# Patient Record
Sex: Female | Born: 1990
Health system: Southern US, Community
[De-identification: ages and names within clinical notes are randomized; demographics above are authoritative.]

## PROBLEM LIST (undated history)

## (undated) DIAGNOSIS — J45909 Unspecified asthma, uncomplicated: Secondary | ICD-10-CM

---

## 2016-02-21 ENCOUNTER — Emergency Department (HOSPITAL_BASED_OUTPATIENT_CLINIC_OR_DEPARTMENT_OTHER): Payer: Self-pay

## 2016-02-21 ENCOUNTER — Encounter (HOSPITAL_BASED_OUTPATIENT_CLINIC_OR_DEPARTMENT_OTHER): Payer: Self-pay

## 2016-02-21 ENCOUNTER — Emergency Department (HOSPITAL_BASED_OUTPATIENT_CLINIC_OR_DEPARTMENT_OTHER)
Admission: EM | Admit: 2016-02-21 | Discharge: 2016-02-22 | Disposition: A | Discharge status: Home / Self Care | Payer: Self-pay | Attending: Emergency Medicine | Admitting: Emergency Medicine

## 2016-02-21 DIAGNOSIS — R55 Syncope and collapse: Secondary | ICD-10-CM | POA: Insufficient documentation

## 2016-02-21 DIAGNOSIS — J45909 Unspecified asthma, uncomplicated: Secondary | ICD-10-CM | POA: Insufficient documentation

## 2016-02-21 DIAGNOSIS — Z3490 Encounter for supervision of normal pregnancy, unspecified, unspecified trimester: Secondary | ICD-10-CM

## 2016-02-21 DIAGNOSIS — Z3A13 13 weeks gestation of pregnancy: Secondary | ICD-10-CM | POA: Insufficient documentation

## 2016-02-21 DIAGNOSIS — O2342 Unspecified infection of urinary tract in pregnancy, second trimester: Secondary | ICD-10-CM

## 2016-02-21 DIAGNOSIS — O9989 Other specified diseases and conditions complicating pregnancy, childbirth and the puerperium: Secondary | ICD-10-CM | POA: Insufficient documentation

## 2016-02-21 DIAGNOSIS — O2341 Unspecified infection of urinary tract in pregnancy, first trimester: Secondary | ICD-10-CM | POA: Insufficient documentation

## 2016-02-21 HISTORY — DX: Unspecified asthma, uncomplicated: J45.909

## 2016-02-21 LAB — URINE MICROSCOPIC-ADD ON

## 2016-02-21 LAB — COMPREHENSIVE METABOLIC PANEL
ALBUMIN: 3.4 g/dL — AB (ref 3.5–5.0)
ALT: 14 U/L (ref 14–54)
ANION GAP: 7 (ref 5–15)
AST: 20 U/L (ref 15–41)
Alkaline Phosphatase: 54 U/L (ref 38–126)
BILIRUBIN TOTAL: 0.4 mg/dL (ref 0.3–1.2)
BUN: 7 mg/dL (ref 6–20)
CHLORIDE: 103 mmol/L (ref 101–111)
CO2: 25 mmol/L (ref 22–32)
Calcium: 8.5 mg/dL — ABNORMAL LOW (ref 8.9–10.3)
Creatinine, Ser: 0.43 mg/dL — ABNORMAL LOW (ref 0.44–1.00)
GFR calc Af Amer: >60 mL/min (ref >60)
GFR calc non Af Amer: >60 mL/min (ref >60)
GLUCOSE: 82 mg/dL (ref 65–99)
POTASSIUM: 3.2 mmol/L — AB (ref 3.5–5.1)
SODIUM: 135 mmol/L (ref 135–145)
TOTAL PROTEIN: 7.1 g/dL (ref 6.5–8.1)

## 2016-02-21 LAB — CBC WITH DIFFERENTIAL/PLATELET
Basophils Absolute: 0 10*3/uL (ref 0.0–0.1)
Basophils Relative: 0 %
EOS PCT: 2 %
Eosinophils Absolute: 0.2 10*3/uL (ref 0.0–0.7)
HEMATOCRIT: 30.6 % — AB (ref 36.0–46.0)
Hemoglobin: 10.6 g/dL — ABNORMAL LOW (ref 12.0–15.0)
LYMPHS ABS: 1.8 10*3/uL (ref 0.7–4.0)
LYMPHS PCT: 22 %
MCH: 27.1 pg (ref 26.0–34.0)
MCHC: 34.6 g/dL (ref 30.0–36.0)
MCV: 78.3 fL (ref 78.0–100.0)
MONO ABS: 0.5 10*3/uL (ref 0.1–1.0)
MONOS PCT: 6 %
NEUTROS ABS: 5.8 10*3/uL (ref 1.7–7.7)
Neutrophils Relative %: 70 %
PLATELETS: 252 10*3/uL (ref 150–400)
RBC: 3.91 MIL/uL (ref 3.87–5.11)
RDW: 14.5 % (ref 11.5–15.5)
WBC: 8.3 10*3/uL (ref 4.0–10.5)

## 2016-02-21 LAB — URINALYSIS, ROUTINE W REFLEX MICROSCOPIC
Bilirubin Urine: NEGATIVE
GLUCOSE, UA: NEGATIVE mg/dL
KETONES UR: NEGATIVE mg/dL
Nitrite: POSITIVE — AB
PH: 5.5 (ref 5.0–8.0)
PROTEIN: NEGATIVE mg/dL
Specific Gravity, Urine: 1.015 (ref 1.005–1.030)

## 2016-02-21 LAB — LIPASE, BLOOD: Lipase: 28 U/L (ref 11–51)

## 2016-02-21 LAB — PREGNANCY, URINE: Preg Test, Ur: POSITIVE — AB

## 2016-02-21 LAB — HCG, QUANTITATIVE, PREGNANCY: hCG, Beta Chain, Quant, S: 64235 m[IU]/mL — ABNORMAL HIGH (ref <5)

## 2016-02-21 MED ORDER — SODIUM CHLORIDE 0.9 % IV BOLUS (SEPSIS)
1000.0000 mL | Freq: Once | INTRAVENOUS | Status: AC
  Administered 2016-02-21: 1000 mL via INTRAVENOUS

## 2016-02-21 NOTE — ED Notes (Addendum)
Pt states she passed out at work approx 1pm-positive home preg test-LMP Dec 2016-no medical f/u- G4 P3-NAD-steady gait

## 2016-02-21 NOTE — ED Provider Notes (Signed)
CSN: [id number]     Arrival date & time 02/21/16  2213 History   First MD Initiated Contact with Patient 02/21/16 2239     Chief Complaint  Patient presents with  . Loss of Consciousness    Krista Freeman is a 25 y.o. who is [redacted] weeks pregnant and presents to the emergency department after a syncopal episode approximately 10-1/2 hours prior to arrival to the emergency department. Patient reports her last ventral cycle was 12/01/2015. She's had no prenatal care. She complains of some suprapubic abdominal pain tonight. She reports today she's been feeling lightheaded with position change. She reports having a syncopal episode when she fell to the ground at work today. She denies any prodromal symptoms of chest pain or shortness of breath. She denies any vaginal bleeding or vaginal discharge. No urinary symptoms. She denies fevers, headache, numbness, tingling, weakness, chest pain, shortness of breath, vomiting, nausea, diarrhea, or rashes.  The history is provided by the patient. No language interpreter was used.    Past Medical History  Diagnosis Date  . Asthma    History reviewed. No pertinent past surgical history. No family history on file. Social History  Substance Use Topics  . Smoking status: Never Smoker   . Smokeless tobacco: None  . Alcohol Use: No   OB History    Gravida Para Term Preterm AB TAB SAB Ectopic Multiple Living   1              Review of Systems  Constitutional: Negative for fever and chills.  HENT: Negative for congestion and sore throat.   Eyes: Negative for visual disturbance.  Respiratory: Negative for cough, shortness of breath and wheezing.   Cardiovascular: Negative for chest pain and palpitations.  Gastrointestinal: Positive for abdominal pain. Negative for nausea, vomiting and diarrhea.  Genitourinary: Negative for dysuria, urgency, frequency, hematuria, flank pain, decreased urine volume, vaginal bleeding, vaginal discharge, vaginal pain and pelvic  pain.  Musculoskeletal: Negative for back pain and neck pain.  Skin: Negative for rash.  Neurological: Positive for syncope and light-headedness. Negative for dizziness, weakness, numbness and headaches.      Allergies  Review of patient's allergies indicates no known allergies.  Home Medications   Prior to Admission medications   Medication Sig Start Date End Date Taking? Authorizing Provider  nitrofurantoin, macrocrystal-monohydrate, (MACROBID) 100 MG capsule Take 1 capsule (100 mg total) by mouth 2 (two) times daily. 02/22/16   Everlene Farrier, PA-C   BP 111/60 mmHg  Pulse 77  Temp(Src) 98.2 F (36.8 C) (Oral)  Resp 18  Ht  (1.626 m)  Wt 81.647 kg  BMI 30.88 kg/m2  SpO2 100%  LMP 12/01/2015 Physical Exam  Constitutional: She is oriented to person, place, and time. She appears well-developed and well-nourished. No distress.  Nontoxic appearing.  HENT:  Head: Normocephalic and atraumatic.  Right Ear: External ear normal.  Left Ear: External ear normal.  Mouth/Throat: Oropharynx is clear and moist.  No visible signs of head trauma.  Eyes: Conjunctivae are normal. Pupils are equal, round, and reactive to light. Right eye exhibits no discharge. Left eye exhibits no discharge.  Neck: Normal range of motion. Neck supple. No JVD present. No tracheal deviation present.  Cardiovascular: Normal rate, regular rhythm, normal heart sounds and intact distal pulses.  Exam reveals no gallop and no friction rub.   No murmur heard. Pulmonary/Chest: Effort normal and breath sounds normal. No respiratory distress. She has no wheezes. She has no rales.  Abdominal:  Soft. Bowel sounds are normal. She exhibits no distension and no mass. There is no tenderness. There is no rebound and no guarding.  Abdomen is soft and nontender to palpation. Bowel sounds are present. No peritoneal signs.  Musculoskeletal: She exhibits no edema or tenderness.  No lower extremity edema or tenderness. Patient is  spontaneously moving all extremities in a coordinated fashion exhibiting good strength.   Lymphadenopathy:    She has no cervical adenopathy.  Neurological: She is alert and oriented to person, place, and time. No cranial nerve deficit. Coordination normal.  The patient is alert and oriented 3. Cranial nerves are intact. Sensation is intact her bilateral upper and lower extremities. Speech is clear and coherent. Normal gait.  Skin: Skin is warm and dry. No rash noted. She is not diaphoretic. No erythema. No pallor.  Psychiatric: She has a normal mood and affect. Her behavior is normal.  Nursing note and vitals reviewed.   ED Course  Procedures (including critical care time) Labs Review Labs Reviewed  URINALYSIS, ROUTINE W REFLEX MICROSCOPIC (NOT AT [facility]) - Abnormal; Notable for the following:    Hgb urine dipstick TRACE (*)    Nitrite POSITIVE (*)    Leukocytes, UA MODERATE (*)    All other components within normal limits  PREGNANCY, URINE - Abnormal; Notable for the following:    Preg Test, Ur POSITIVE (*)    All other components within normal limits  URINE MICROSCOPIC-ADD ON - Abnormal; Notable for the following:    Squamous Epithelial / LPF 0-5 (*)    Bacteria, UA MANY (*)    All other components within normal limits  COMPREHENSIVE METABOLIC PANEL - Abnormal; Notable for the following:    Potassium 3.2 (*)    Creatinine, Ser 0.43 (*)    Calcium 8.5 (*)    Albumin 3.4 (*)    All other components within normal limits  CBC WITH DIFFERENTIAL/PLATELET - Abnormal; Notable for the following:    Hemoglobin 10.6 (*)    HCT 30.6 (*)    All other components within normal limits  HCG, QUANTITATIVE, PREGNANCY - Abnormal; Notable for the following:    hCG, Beta Chain, Quant, S [id number] (*)    All other components within normal limits  URINE CULTURE  LIPASE, BLOOD    Imaging Review [place] Ob Comp Less 14 Wks  02/22/2016  CLINICAL DATA:  Acute onset of syncope and fall at work. Initial  encounter. EXAM: OBSTETRIC <14 WK ULTRASOUND TECHNIQUE: Transabdominal ultrasound was performed for evaluation of the gestation as well as the maternal uterus and adnexal regions. COMPARISON:  None. FINDINGS: Intrauterine gestational sac: Visualized/normal in shape. Yolk sac:  Yes Embryo:  Yes Cardiac Activity: Yes Heart Rate: 150 bpm BPD:   18.9  mm   13 w 0 d                  US EDC: 08/28/2016 Subchorionic hemorrhage: A small amount of subchorionic hemorrhage is noted. Maternal uterus/adnexae: The uterus is grossly unremarkable in appearance. The ovaries are within normal limits. The right ovary measures 3.6 x 2.3 x 3.3 cm, while the left ovary measures 3.3 x 1.6 x 3.0 cm. There is no evidence for ovarian torsion. No suspicious adnexal masses are seen. No free fluid is seen within the pelvic cul-de-sac. IMPRESSION: 1. Single live intrauterine pregnancy noted, with a biparietal diameter of 1.9 cm, corresponding to a gestational age of [redacted] weeks 0 days. This does not match the gestational age by LMP,  and reflects a new estimated date of delivery of August 28, 2016. 2. Small amount of subchorionic hemorrhage noted. Electronically Signed   By: Roanna Raider M.D.   On: 02/22/2016 00:17   I have personally reviewed and evaluated these images and lab results as part of my medical decision-making.   EKG Interpretation None      Filed Vitals:   02/21/16 2220 02/22/16 0035  BP: 127/72 111/60  Pulse: 68 77  Temp: 99.4 F (37.4 C) 98.2 F (36.8 C)  TempSrc: Oral Oral  Resp: 16 18  Height:  (1.626 m)   Weight: 81.647 kg   SpO2: 100% 100%     MDM   Meds given in ED:  Medications  sodium chloride 0.9 % bolus 1,000 mL (1,000 mLs Intravenous New Bag/Given 02/21/16 2325)    New Prescriptions   NITROFURANTOIN, MACROCRYSTAL-MONOHYDRATE, (MACROBID) 100 MG CAPSULE    Take 1 capsule (100 mg total) by mouth 2 (two) times daily.    Final diagnoses:  Pregnant  UTI in pregnancy, second trimester   Syncope and collapse   This is a 25 y.o. who is [redacted] weeks pregnant and presents to the emergency department after a syncopal episode approximately 10-1/2 hours prior to arrival to the emergency department. Patient reports her last ventral cycle was 12/01/2015. She's had no prenatal care. She complains of some suprapubic abdominal pain tonight. She reports today she's been feeling lightheaded with position change. She reports having a syncopal episode when she fell to the ground at work today. She denies any prodromal symptoms of chest pain or shortness of breath. She denies any vaginal bleeding or vaginal discharge. No urinary symptoms. On exam the patient is afebrile and nontoxic appearing. Her abdomen is soft and nontender to palpation. Her lungs are clear to auscultation bilaterally. She has no focal neurological deficits. CMP is unremarkable. CBC shows a single 10.6. Urinalysis shows a nitrite positive urine with moderate leukocytes. Urine sent for culture. Quantitative hCG is 64,235. OB ultrasound reveals a single live intrauterine pregnancy with an estimated gestational age of [redacted] weeks. There is a small amount of subchorionic hemorrhage also noted. Patient is not orthostatic after receiving a 1 L fluid bolus. She denies been lightheaded or dizzy when standing. Will discharge with close follow-up by OB/GYN. We'll discharge with prescription for Macrobid for urinary tract infection. I discussed strict and specific return precautions. I advised the patient to follow-up with their primary care provider this week. I advised the patient to return to the emergency department with new or worsening symptoms or new concerns. The patient verbalized understanding and agreement with plan.    This patient was discussed with Dr. Nicanor Alcon who agrees with assessment and plan.     Everlene Farrier, PA-C 02/22/16 1610  Rolland Porter, MD 03/02/16 2300

## 2016-02-21 NOTE — ED Notes (Signed)
Pt states she passed out at work app 10.5 hours ago,  Having some dizziness and ha,  At present no distress, laughing and watching tv

## 2016-02-22 MED ORDER — NITROFURANTOIN MONOHYD MACRO 100 MG PO CAPS: 100.0000 mg | ORAL_CAPSULE | Freq: Two times a day (BID) | ORAL | Status: DC

## 2016-02-22 NOTE — Discharge Instructions (Signed)
Pregnancy and Urinary Tract Infection °A urinary tract infection (UTI) is a bacterial infection of the urinary tract. Infection of the urinary tract can include the ureters, kidneys (pyelonephritis), bladder (cystitis), and urethra (urethritis). All pregnant women should be screened for bacteria in the urinary tract. Identifying and treating a UTI will decrease the risk of preterm labor and developing more serious infections in both the mother and baby. °CAUSES °Bacteria germs cause almost all UTIs.  °RISK FACTORS °Many factors can increase your chances of getting a UTI during pregnancy. These include: °· Having a short urethra. °· Poor toilet and hygiene habits. °· Sexual intercourse. °· Blockage of urine along the urinary tract. °· Problems with the pelvic muscles or nerves. °· Diabetes. °· Obesity. °· Bladder problems after having several children. °· Previous history of UTI. °SIGNS AND SYMPTOMS  °· Pain, burning, or a stinging feeling when urinating. °· Suddenly feeling the need to urinate right away (urgency). °· Loss of bladder control (urinary incontinence). °· Frequent urination, more than is common with pregnancy. °· Lower abdominal or back discomfort. °· Cloudy urine. °· Blood in the urine (hematuria). °· Fever.  °When the kidneys are infected, the symptoms may be: °· Back pain. °· Flank pain on the right side more so than the left. °· Fever. °· Chills. °· Nausea. °· Vomiting. °DIAGNOSIS  °A urinary tract infection is usually diagnosed through urine tests. Additional tests and procedures are sometimes done. These may include: °· Ultrasound exam of the kidneys, ureters, bladder, and urethra. °· Looking in the bladder with a lighted tube (cystoscopy). °TREATMENT °Typically, UTIs can be treated with antibiotic medicines.  °HOME CARE INSTRUCTIONS  °· Only take over-the-counter or prescription medicines as directed by your health care provider. If you were prescribed antibiotics, take them as directed. Finish  them even if you start to feel better. °· Drink enough fluids to keep your urine clear or pale yellow. °· Do not have sexual intercourse until the infection is gone and your health care provider says it is okay. °· Make sure you are tested for UTIs throughout your pregnancy. These infections often come back.  °Preventing a UTI in the Future °· Practice good toilet habits. Always wipe from front to back. Use the tissue only once. °· Do not hold your urine. Empty your bladder as soon as possible when the urge comes. °· Do not douche or use deodorant sprays. °· Wash with soap and warm water around the genital area and the anus. °· Empty your bladder before and after sexual intercourse. °· Wear underwear with a cotton crotch. °· Avoid caffeine and carbonated drinks. They can irritate the bladder. °· Drink cranberry juice or take cranberry pills. This may decrease the risk of getting a UTI. °· Do not drink alcohol. °· Keep all your appointments and tests as scheduled.  °SEEK MEDICAL CARE IF:  °· Your symptoms get worse. °· You are still having fevers 2 or more days after treatment begins. °· You have a rash. °· You feel that you are having problems with medicines prescribed. °· You have abnormal vaginal discharge. °SEEK IMMEDIATE MEDICAL CARE IF:  °· You have back or flank pain. °· You have chills. °· You have blood in your urine. °· You have nausea and vomiting. °· You have contractions of your uterus. °· You have a gush of fluid from the vagina. °MAKE SURE YOU: °· Understand these instructions.   °· Will watch your condition.   °· Will get help right away if you are not doing   well or get worse.    This information is not intended to replace advice given to you by your health care provider. Make sure you discuss any questions you have with your health care provider.   Document Released: 03/22/2011 Document Revised: 09/15/2013 Document Reviewed: 06/24/2013 Elsevier Interactive Patient Education 2016 [employer].  Eating Plan for Pregnant Women While you are pregnant, your body will require additional nutrition to help support your growing baby. It is recommended that you consume:  150 additional calories each day during your first trimester.  300 additional calories each day during your second trimester.  300 additional calories each day during your third trimester. Eating a healthy, well-balanced diet is very important for your health and for your baby's health. You also have a higher need for some vitamins and minerals, such as folic acid, calcium, iron, and vitamin D. WHAT DO I NEED TO KNOW ABOUT EATING DURING PREGNANCY?  Do not try to lose weight or go on a diet during pregnancy.  Choose healthy, nutritious foods. Choose  of a sandwich with a glass of milk instead of a candy bar or a high-calorie sugar-sweetened beverage.  Limit your overall intake of foods that have "empty calories." These are foods that have little nutritional value, such as sweets, desserts, candies, sugar-sweetened beverages, and fried foods.  Eat a variety of foods, especially fruits and vegetables.  Take a prenatal vitamin to help meet the additional needs during pregnancy, specifically for folic acid, iron, calcium, and vitamin D.  Remember to stay active. Ask your health care provider for exercise recommendations that are specific to you.  Practice good food safety and cleanliness, such as washing your hands before you eat and after you prepare raw meat. This helps to prevent foodborne illnesses, such as listeriosis, that can be very dangerous for your baby. Ask your health care provider for more information about listeriosis. WHAT DOES 150 EXTRA CALORIES LOOK LIKE? Healthy options for an additional 150 calories each day could be any of the following:  Plain low-fat yogurt (6-8 oz) with  cup of berries.  1 apple with 2 teaspoons of peanut butter.  Cut-up vegetables with  cup of hummus.  Low-fat  chocolate milk (8 oz or 1 cup).  1 string cheese with 1 medium orange.   of a peanut butter and jelly sandwich on whole-wheat bread (1 tsp of peanut butter). For 300 calories, you could eat two of those healthy options each day.  WHAT IS A HEALTHY AMOUNT OF WEIGHT TO GAIN? The recommended amount of weight for you to gain is based on your pre-pregnancy BMI. If your pre-pregnancy BMI was:  Less than 18 (underweight), you should gain 28-40 lb.  18-24.9 (normal), you should gain 25-35 lb.  25-29.9 (overweight), you should gain 15-25 lb.  Greater than 30 (obese), you should gain 11-20 lb. WHAT IF I AM HAVING TWINS OR MULTIPLES? Generally, pregnant women who will be having twins or multiples may need to increase their daily calories by 300-600 calories each day. The recommended range for total weight gain is 25-54 lb, depending on your pre-pregnancy BMI. Talk with your health care provider for specific guidance about additional nutritional needs, weight gain, and exercise during your pregnancy. WHAT FOODS CAN I EAT? Grains Any grains. Try to choose whole grains, such as whole-wheat bread, oatmeal, or brown rice. Vegetables Any vegetables. Try to eat a variety of colors and types of vegetables to get a full range of vitamins and minerals. Remember to  wash your vegetables well before eating. Fruits Any fruits. Try to eat a variety of colors and types of fruit to get a full range of vitamins and minerals. Remember to wash your fruits well before eating. Meats and Other Protein Sources Lean meats, including chicken, [place], fish, and lean cuts of beef, veal, or pork. Make sure that all meats are cooked to "well done." Tofu. Tempeh. Beans. Eggs. Peanut butter and other nut butters. Seafood, such as shrimp, crab, and lobster. If you choose fish, select types that are higher in omega-3 fatty acids, including salmon, herring, mussels, trout, sardines, and pollock. Make sure that all meats are cooked to  food-safe temperatures. Dairy Pasteurized milk and milk alternatives. Pasteurized yogurt and pasteurized cheese. Cottage cheese. Sour cream. Beverages Water. Juices that contain 100% fruit juice or vegetable juice. Caffeine-free teas and decaffeinated coffee. Drinks that contain caffeine are okay to drink, but it is better to avoid caffeine. Keep your total caffeine intake to less than 200 mg each day (12 oz of coffee, tea, or soda) or as directed by your health care provider. Condiments Any pasteurized condiments. Sweets and Desserts Any sweets and desserts. Fats and Oils Any fats and oils. The items listed above may not be a complete list of recommended foods or beverages. Contact your dietitian for more options. WHAT FOODS ARE NOT RECOMMENDED? Vegetables Unpasteurized (raw) vegetable juices. Fruits Unpasteurized (raw) fruit juices. Meats and Other Protein Sources Cured meats that have nitrates, such as bacon, salami, and hotdogs. Luncheon meats, bologna, or other deli meats (unless they are reheated until they are steaming hot). Refrigerated pate, meat spreads from a meat counter, smoked seafood that is found in the refrigerated section of a store. Raw fish, such as sushi or sashimi. High mercury content fish, such as tilefish, shark, swordfish, and king mackerel. Raw meats, such as tuna or beef tartare. Undercooked meats and poultry. Make sure that all meats are cooked to food-safe temperatures. Dairy Unpasteurized (raw) milk and any foods that have raw milk in them. Soft cheeses, such as feta, queso blanco, queso fresco, Brie, Camembert cheeses, blue-veined cheeses, and Panela cheese (unless it is made with pasteurized milk, which must be stated on the label). Beverages Alcohol. Sugar-sweetened beverages, such as sodas, teas, or energy drinks. Condiments Homemade fermented foods and drinks, such as pickles, sauerkraut, or kombucha drinks. (Store-bought pasteurized versions of these are  okay.) Other Salads that are made in the store, such as ham salad, chicken salad, egg salad, tuna salad, and seafood salad. The items listed above may not be a complete list of foods and beverages to avoid. Contact your dietitian for more information.   This information is not intended to replace advice given to you by your health care provider. Make sure you discuss any questions you have with your health care provider.   Document Released: 09/09/2014 Document Reviewed: 09/09/2014 Elsevier Interactive Patient Education [employer] Inc[employer]

## 2016-02-24 ENCOUNTER — Encounter (HOSPITAL_BASED_OUTPATIENT_CLINIC_OR_DEPARTMENT_OTHER): Payer: Self-pay | Admitting: Emergency Medicine

## 2016-02-24 DIAGNOSIS — G8929 Other chronic pain: Secondary | ICD-10-CM | POA: Insufficient documentation

## 2016-02-24 DIAGNOSIS — J45909 Unspecified asthma, uncomplicated: Secondary | ICD-10-CM | POA: Insufficient documentation

## 2016-02-24 DIAGNOSIS — Z792 Long term (current) use of antibiotics: Secondary | ICD-10-CM | POA: Insufficient documentation

## 2016-02-24 DIAGNOSIS — R51 Headache: Secondary | ICD-10-CM | POA: Insufficient documentation

## 2016-02-24 MED ORDER — IBUPROFEN 800 MG PO TABS
800.0000 mg | ORAL_TABLET | Freq: Once | ORAL | Status: AC
  Administered 2016-02-24: 800 mg via ORAL
  Filled 2016-02-24: qty 1
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
## 2016-02-25 ENCOUNTER — Emergency Department (HOSPITAL_BASED_OUTPATIENT_CLINIC_OR_DEPARTMENT_OTHER)
Admission: EM | Admit: 2016-02-25 | Discharge: 2016-02-25 | Disposition: A | Discharge status: Home / Self Care | Payer: Self-pay | Attending: Emergency Medicine | Admitting: Emergency Medicine

## 2016-02-25 ENCOUNTER — Encounter (HOSPITAL_BASED_OUTPATIENT_CLINIC_OR_DEPARTMENT_OTHER): Payer: Self-pay | Admitting: Emergency Medicine

## 2016-02-25 DIAGNOSIS — R519 Headache, unspecified: Secondary | ICD-10-CM

## 2016-02-25 DIAGNOSIS — R51 Headache: Secondary | ICD-10-CM

## 2016-02-25 LAB — URINE CULTURE

## 2016-02-25 MED ORDER — ACETAMINOPHEN 500 MG PO TABS: 500.0000 mg | ORAL_TABLET | Freq: Four times a day (QID) | ORAL | Status: DC | PRN

## 2016-02-25 MED ORDER — ACETAMINOPHEN 500 MG PO TABS
1000.0000 mg | ORAL_TABLET | Freq: Once | ORAL | Status: AC
  Administered 2016-02-25: 1000 mg via ORAL
  Filled 2016-02-25: qty 2

## 2016-02-25 MED ORDER — LIDOCAINE HCL 2 % EX GEL
1.0000 "application " | Freq: Once | CUTANEOUS | Status: AC
  Administered 2016-02-25: 1
  Filled 2016-02-25: qty 20
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
## 2016-02-25 NOTE — ED Notes (Signed)
Dr. Palumbo at [facility] 

## 2016-02-25 NOTE — ED Provider Notes (Signed)
CSN: [id number]     Arrival date & time 02/24/16  2315 History   First MD Initiated Contact with Patient 02/25/16 0204     Chief Complaint  Patient presents with  . Facial Pain     (Consider location/radiation/quality/duration/timing/severity/associated sxs/prior Treatment) Patient is a 25 y.o. female presenting with general illness. The history is provided by the patient.  Illness Location:  Left cheek Quality:  Achy Severity:  Moderate Onset quality:  Gradual Duration:  8 months Timing:  Constant Progression:  Waxing and waning Chronicity:  Chronic Context:  Previous facial trauma in that area Relieved by:  Nothing Worsened by:  Nothing Ineffective treatments:  None triad Associated symptoms: no abdominal pain, no congestion, no fever, no loss of consciousness, no rhinorrhea, no shortness of breath and no sore throat     Past Medical History  Diagnosis Date  . Asthma    History reviewed. No pertinent past surgical history. History reviewed. No pertinent family history. Social History  Substance Use Topics  . Smoking status: Never Smoker   . Smokeless tobacco: None  . Alcohol Use: No   OB History    Gravida Para Term Preterm AB TAB SAB Ectopic Multiple Living   1              Review of Systems  Constitutional: Negative for fever.  HENT: Negative for congestion, drooling, facial swelling, hearing loss, rhinorrhea and sore throat.   Respiratory: Negative for shortness of breath.   Gastrointestinal: Negative for abdominal pain.  Neurological: Negative for loss of consciousness.  All other systems reviewed and are negative.     Allergies  Review of patient's allergies indicates no known allergies.  Home Medications   Prior to Admission medications   Medication Sig Start Date End Date Taking? Authorizing Provider  nitrofurantoin, macrocrystal-monohydrate, (MACROBID) 100 MG capsule Take 1 capsule (100 mg total) by mouth 2 (two) times daily. 02/22/16   Everlene FarrierWilliam  Dansie, PA-C   BP 143/100 mmHg  Pulse 92  Temp(Src) 98.6 F (37 C) (Oral)  Resp 22  Ht 5\' 4"  (1.626 m)  Wt 180 lb (81.647 kg)  BMI 30.88 kg/m2  SpO2 100%  LMP 12/01/2015 Physical Exam  Constitutional: She is oriented to person, place, and time. She appears well-developed and well-nourished. No distress.  HENT:  Head: Normocephalic and atraumatic.  Mouth/Throat: Oropharynx is clear and moist.  Eyes: Conjunctivae and EOM are normal. Pupils are equal, round, and reactive to light.  Neck: Normal range of motion. Neck supple.  Cardiovascular: Normal rate, regular rhythm and intact distal pulses.   Pulmonary/Chest: Effort normal and breath sounds normal. No respiratory distress. She has no wheezes. She has no rales.  Abdominal: Soft. Bowel sounds are normal. There is no tenderness. There is no rebound and no guarding.  gravid  Musculoskeletal: Normal range of motion.  Neurological: She is alert and oriented to person, place, and time.  Skin: Skin is warm and dry.  Psychiatric: She has a normal mood and affect.    ED Course  Procedures (including critical care time) Labs Review Labs Reviewed - No data to display  Imaging Review No results found. I have personally reviewed and evaluated these images and lab results as part of my medical decision-making.   EKG Interpretation None     I did not authorize nor did I order the ibuprofen.  I was ordered by the triage nurse without consultation MDM   Final diagnoses:  None    Tylenol every six  hours as needed for pain.      Cy Blamer, MD 02/25/16 [phone]

## 2016-02-26 ENCOUNTER — Telehealth (HOSPITAL_BASED_OUTPATIENT_CLINIC_OR_DEPARTMENT_OTHER): Payer: Self-pay | Admitting: Emergency Medicine

## 2016-02-26 NOTE — Telephone Encounter (Signed)
Post ED Visit - Positive Culture Follow-up  Culture report reviewed by antimicrobial stewardship pharmacist:  [x]  Enzo BiNathan Batchelder, Pharm.D. []  Celedonio MiyamotoJeremy Frens, Pharm.D., BCPS []  Garvin FilaMike Maccia, Pharm.D. []  Georgina PillionElizabeth Martin, Pharm.D., BCPS []  Deep RiverMinh Pham, [address]Pharm[address]D., BCPS, AAHIVP []  Estella HuskMichelle Turner, Pharm.D., BCPS, AAHIVP []  Tennis Mustassie Stewart, Pharm.D. []  Sherle Poeob Vincent, [address]Pharm[address]  Positive urine culture Treated with nitrofurantoin, organism sensitive to the same and no further patient follow-up is required at this time.  Berle MullMiller, Yui Mulvaney 02/26/2016, 9:54 AM

## 2016-04-29 ENCOUNTER — Encounter (HOSPITAL_BASED_OUTPATIENT_CLINIC_OR_DEPARTMENT_OTHER): Payer: Self-pay | Admitting: *Deleted

## 2016-04-29 ENCOUNTER — Emergency Department (HOSPITAL_BASED_OUTPATIENT_CLINIC_OR_DEPARTMENT_OTHER)
Admission: EM | Admit: 2016-04-29 | Discharge: 2016-04-29 | Disposition: A | Discharge status: Home / Self Care | Payer: Medicaid Other | Attending: Emergency Medicine | Admitting: Emergency Medicine

## 2016-04-29 DIAGNOSIS — R1030 Lower abdominal pain, unspecified: Secondary | ICD-10-CM | POA: Diagnosis not present

## 2016-04-29 DIAGNOSIS — Z3A21 21 weeks gestation of pregnancy: Secondary | ICD-10-CM | POA: Insufficient documentation

## 2016-04-29 DIAGNOSIS — O26892 Other specified pregnancy related conditions, second trimester: Secondary | ICD-10-CM | POA: Diagnosis present

## 2016-04-29 DIAGNOSIS — O99512 Diseases of the respiratory system complicating pregnancy, second trimester: Secondary | ICD-10-CM | POA: Insufficient documentation

## 2016-04-29 DIAGNOSIS — J45909 Unspecified asthma, uncomplicated: Secondary | ICD-10-CM | POA: Insufficient documentation

## 2016-04-29 DIAGNOSIS — O2342 Unspecified infection of urinary tract in pregnancy, second trimester: Secondary | ICD-10-CM | POA: Diagnosis not present

## 2016-04-29 LAB — URINE MICROSCOPIC-ADD ON: RBC / HPF: NONE SEEN RBC/hpf (ref 0–5)

## 2016-04-29 LAB — URINALYSIS, ROUTINE W REFLEX MICROSCOPIC
Bilirubin Urine: NEGATIVE
Glucose, UA: NEGATIVE mg/dL
Hgb urine dipstick: NEGATIVE
KETONES UR: NEGATIVE mg/dL
NITRITE: POSITIVE — AB
PH: 7 (ref 5.0–8.0)
PROTEIN: NEGATIVE mg/dL
Specific Gravity, Urine: 1.012 (ref 1.005–1.030)

## 2016-04-29 MED ORDER — NITROFURANTOIN MONOHYD MACRO 100 MG PO CAPS: 100.0000 mg | ORAL_CAPSULE | Freq: Two times a day (BID) | ORAL | Status: DC

## 2016-04-29 MED ORDER — NITROFURANTOIN MONOHYD MACRO 100 MG PO CAPS
100.0000 mg | ORAL_CAPSULE | Freq: Once | ORAL | Status: AC
  Administered 2016-04-29: 100 mg via ORAL
  Filled 2016-04-29: qty 1

## 2016-04-29 NOTE — ED Notes (Signed)
Krista Freeman, ob rapid response team states pt is ok to come off monitors and be treated by edp and sent home.

## 2016-04-29 NOTE — ED Notes (Signed)
Abdominal pain. She is [redacted] weeks pregnant. No prenatal care. She denies vaginal discharge. This her 4th pregnancy. States she does not feel she is in labor.

## 2016-04-29 NOTE — ED Notes (Addendum)
Pt placed on tocomonitor and fetal monitor, fetal hr 160's. Mom states she had a "twinge" in her abd last night, none since then. Mom states she is here to find out the sex of her baby. Trula OreChristina of the ob rapid response team phoned, will monitor pt and call me back.

## 2016-04-29 NOTE — Progress Notes (Signed)
Amy RN called from [facility], pt came with no complaints and wanting to know the sex of the baby. G4P3 21.3 per LMP, pt has had no prenatal care. Dr Adrian Blackwaterstinson reviewed tracing, OB cleared and can come off FHR monitor. Amy RN notified.

## 2016-04-29 NOTE — ED Provider Notes (Signed)
CSN: [id number]     Arrival date & time 04/29/16  1534 History   First MD Initiated Contact with Patient 04/29/16 1657     Chief Complaint  Patient presents with  . Abdominal Pain   HPI Krista Freeman is a 25 y.o. [redacted] week pregnant female PMH significant for asthma presenting with a one episode of abdominal pain last night. She described the pain as 7/10 pain scale, nonradiating, suprapubic in location, a twinge, dissimilar to labor pains. She denies fevers, chills, chest pain, shortness of breath, abdominal pain since episode, nausea, vomiting, vaginal or urinary complaints.  Past Medical History  Diagnosis Date  . Asthma    History reviewed. No pertinent past surgical history. No family history on file. Social History  Substance Use Topics  . Smoking status: Never Smoker   . Smokeless tobacco: None  . Alcohol Use: No   OB History    Gravida Para Term Preterm AB TAB SAB Ectopic Multiple Living   1              Review of Systems  Ten systems are reviewed and are negative for acute change except as noted in the HPI  Allergies  Review of patient's allergies indicates no known allergies.  Home Medications   Prior to Admission medications   Medication Sig Start Date End Date Taking? Authorizing Provider  acetaminophen (TYLENOL) 500 MG tablet Take 1 tablet (500 mg total) by mouth every 6 (six) hours as needed. 02/25/16   April Palumbo, MD  nitrofurantoin, macrocrystal-monohydrate, (MACROBID) 100 MG capsule Take 1 capsule (100 mg total) by mouth 2 (two) times daily. 02/22/16   Everlene FarrierWilliam Dansie, PA-C   BP 116/77 mmHg  Pulse 81  Temp(Src) 98.7 F (37.1 C) (Oral)  Resp 20  Ht 5\' 4"  (1.626 m)  Wt 88.451 kg  BMI 33.46 kg/m2  SpO2 100%  LMP 12/01/2015 Physical Exam  Constitutional: She appears well-developed and well-nourished. No distress.  HENT:  Head: Normocephalic and atraumatic.  Mouth/Throat: Oropharynx is clear and moist. No oropharyngeal exudate.  Eyes: Conjunctivae are  normal. Pupils are equal, round, and reactive to light. Right eye exhibits no discharge. Left eye exhibits no discharge. No scleral icterus.  Neck: No tracheal deviation present.  Cardiovascular: Normal rate, regular rhythm, normal heart sounds and intact distal pulses.  Exam reveals no gallop and no friction rub.   No murmur heard. Pulmonary/Chest: Effort normal and breath sounds normal. No respiratory distress. She has no wheezes. She has no rales. She exhibits no tenderness.  Abdominal: Soft. Bowel sounds are normal. She exhibits no distension. There is no tenderness. There is no rebound and no guarding.  Genitourinary:  Enlarged uterus consistent with 2nd trimester pregnancy  Musculoskeletal: She exhibits no edema.  Lymphadenopathy:    She has no cervical adenopathy.  Neurological: She is alert. Coordination normal.  Skin: Skin is warm and dry. No rash noted. She is not diaphoretic. No erythema.  Psychiatric: She has a normal mood and affect. Her behavior is normal.  Nursing note and vitals reviewed.   ED Course  Procedures (including critical care time) Labs Review Labs Reviewed  URINALYSIS, ROUTINE W REFLEX MICROSCOPIC (NOT AT [facility]) - Abnormal; Notable for the following:    APPearance CLOUDY (*)    Nitrite POSITIVE (*)    Leukocytes, UA LARGE (*)    All other components within normal limits  URINE MICROSCOPIC-ADD ON - Abnormal; Notable for the following:    Squamous Epithelial / LPF 0-5 (*)  Bacteria, UA MANY (*)    All other components within normal limits   MDM   Final diagnoses:  Urinary tract infection during pregnancy, second trimester   Patient nontoxic-appearing, vital signs stable. Rapid OB cleared patient and noted fetal heart rate in the 160s. UA demonstrates nitrite positive urinary tract infection, likely the cause of patient's pain. Patient will be sent home with Macrobid. Patient is denying vaginal discharge, vaginal bleeding, episodes of abdominal pain  since last night. She is well-appearing today. Recommended patient follow-up with obstetrician. Discussed return precautions. Patient is in understanding and agreement with plan.   Melton Krebs, PA-C 04/29/16 1721  Leta Baptist, MD 05/05/16 [phone]

## 2016-04-29 NOTE — Discharge Instructions (Signed)
Ms. Krista Freeman,  Nice meeting you! Please follow-up with your obstetrician. Prenatal care is very important. Return to the emergency department if you develop fevers, chills, abdominal pain, vaginal bleeding/discharge, new/worsening symptoms. Feel better soon!  S. Lane HackerNicole Dang Mathison, PA-C Pregnancy and Urinary Tract Infection A urinary tract infection (UTI) is a bacterial infection of the urinary tract. Infection of the urinary tract can include the ureters, kidneys (pyelonephritis), bladder (cystitis), and urethra (urethritis). All pregnant women should be screened for bacteria in the urinary tract. Identifying and treating a UTI will decrease the risk of preterm labor and developing more serious infections in both the mother and baby. CAUSES Bacteria germs cause almost all UTIs.  RISK FACTORS Many factors can increase your chances of getting a UTI during pregnancy. These include:  Having a short urethra.  Poor toilet and hygiene habits.  Sexual intercourse.  Blockage of urine along the urinary tract.  Problems with the pelvic muscles or nerves.  Diabetes.  Obesity.  Bladder problems after having several children.  Previous history of UTI. SIGNS AND SYMPTOMS   Pain, burning, or a stinging feeling when urinating.  Suddenly feeling the need to urinate right away (urgency).  Loss of bladder control (urinary incontinence).  Frequent urination, more than is common with pregnancy.  Lower abdominal or back discomfort.  Cloudy urine.  Blood in the urine (hematuria).  Fever. When the kidneys are infected, the symptoms may be:  Back pain.  Flank pain on the right side more so than the left.  Fever.  Chills.  Nausea.  Vomiting. DIAGNOSIS  A urinary tract infection is usually diagnosed through urine tests. Additional tests and procedures are sometimes done. These may include:  Ultrasound exam of the kidneys, ureters, bladder, and urethra.  Looking in the bladder  with a lighted tube (cystoscopy). TREATMENT Typically, UTIs can be treated with antibiotic medicines.  HOME CARE INSTRUCTIONS   Only take over-the-counter or prescription medicines as directed by your health care provider. If you were prescribed antibiotics, take them as directed. Finish them even if you start to feel better.  Drink enough fluids to keep your urine clear or pale yellow.  Do not have sexual intercourse until the infection is gone and your health care provider says it is okay.  Make sure you are tested for UTIs throughout your pregnancy. These infections often come back. Preventing a UTI in the Future  Practice good toilet habits. Always wipe from front to back. Use the tissue only once.  Do not hold your urine. Empty your bladder as soon as possible when the urge comes.  Do not douche or use deodorant sprays.  Wash with soap and warm water around the genital area and the anus.  Empty your bladder before and after sexual intercourse.  Wear underwear with a cotton crotch.  Avoid caffeine and carbonated drinks. They can irritate the bladder.  Drink cranberry juice or take cranberry pills. This may decrease the risk of getting a UTI.  Do not drink alcohol.  Keep all your appointments and tests as scheduled. SEEK MEDICAL CARE IF:   Your symptoms get worse.  You are still having fevers 2 or more days after treatment begins.  You have a rash.  You feel that you are having problems with medicines prescribed.  You have abnormal vaginal discharge. SEEK IMMEDIATE MEDICAL CARE IF:   You have back or flank pain.  You have chills.  You have blood in your urine.  You have nausea and vomiting.  You have contractions of your uterus.  You have a gush of fluid from the vagina. MAKE SURE YOU:  Understand these instructions.   Will watch your condition.   Will get help right away if you are not doing well or get worse.    This information is not  intended to replace advice given to you by your health care provider. Make sure you discuss any questions you have with your health care provider.   Document Released: 03/22/2011 Document Revised: 09/15/2013 Document Reviewed: 06/24/2013 Elsevier Interactive Patient Education [employer]

## 2017-03-04 ENCOUNTER — Encounter (HOSPITAL_COMMUNITY): Payer: Self-pay

## 2017-10-12 ENCOUNTER — Other Ambulatory Visit: Payer: Self-pay

## 2017-10-12 ENCOUNTER — Emergency Department (HOSPITAL_BASED_OUTPATIENT_CLINIC_OR_DEPARTMENT_OTHER)
Admission: EM | Admit: 2017-10-12 | Discharge: 2017-10-12 | Disposition: A | Discharge status: Home / Self Care | Payer: Self-pay | Attending: Emergency Medicine | Admitting: Emergency Medicine

## 2017-10-12 ENCOUNTER — Encounter (HOSPITAL_BASED_OUTPATIENT_CLINIC_OR_DEPARTMENT_OTHER): Payer: Self-pay | Admitting: *Deleted

## 2017-10-12 DIAGNOSIS — J4520 Mild intermittent asthma, uncomplicated: Secondary | ICD-10-CM | POA: Insufficient documentation

## 2017-10-12 MED ORDER — PREDNISONE 20 MG PO TABS: ORAL_TABLET | ORAL | 0 refills | Status: DC

## 2017-10-12 MED ORDER — IPRATROPIUM-ALBUTEROL 0.5-2.5 (3) MG/3ML IN SOLN
RESPIRATORY_TRACT | Status: AC
  Administered 2017-10-12: 3 mL
  Filled 2017-10-12: qty 3

## 2017-10-12 MED ORDER — ALBUTEROL (5 MG/ML) CONTINUOUS INHALATION SOLN
INHALATION_SOLUTION | RESPIRATORY_TRACT | Status: AC
  Administered 2017-10-12: 2.5 mg
  Filled 2017-10-12: qty 0.5

## 2017-10-12 MED ORDER — ALBUTEROL SULFATE HFA 108 (90 BASE) MCG/ACT IN AERS
2.0000 | INHALATION_SPRAY | RESPIRATORY_TRACT | Status: DC | PRN
  Administered 2017-10-12: 2 via RESPIRATORY_TRACT
  Filled 2017-10-12: qty 6.7

## 2017-10-12 NOTE — ED Provider Notes (Signed)
[facility] EMERGENCY DEPARTMENT Provider Note   CSN: [id number] Arrival date & time: 10/12/17  1138     History   Chief Complaint Chief Complaint  Patient presents with  . Asthma    HPI Krista Freeman is a 26 y.o. female.  HPI   26 year old female with history of asthma presenting for evaluation of shortness of breath.  Patient states for the past 2 days she developed cold symptoms including runny nose sneezing and coughing.  It appears to aggravate her asthma and she complaining of increased wheezing.  She normally does not need to use her rescue inhaler and does not have any medication on board therefore she decided to come to the ER for further evaluation.  She denies having fever, chills, productive cough, hemoptysis, or rash.  She is not a smoker.  Denies any prior history of PE or DVT, no recent surgery, prolonged bedrest, recent travel or taking oral hormone.  Past Medical History:  Diagnosis Date  . Asthma     There are no active problems to display for this patient.   History reviewed. No pertinent surgical history.  OB History    Gravida Para Term Preterm AB Living   1             SAB TAB Ectopic Multiple Live Births                   Home Medications    Prior to Admission medications   Not on File    Family History History reviewed. No pertinent family history.  Social History Social History   Tobacco Use  . Smoking status: Never Smoker  Substance Use Topics  . Alcohol use: No  . Drug use: Yes    Frequency: 7.0 times per week    Types: Marijuana     Allergies   Patient has no known allergies.   Review of Systems Review of Systems  All other systems reviewed and are negative.    Physical Exam Updated Vital Signs Pulse (!) 110   Temp 98.9 F (37.2 C) (Oral)   Resp 20   Ht 5\' 4"  (1.626 m)   Wt 99.8 kg (220 lb)   LMP 09/14/2017   SpO2 100%   BMI 37.76 kg/m   Physical Exam  Constitutional: She appears  well-developed and well-nourished. No distress.  HENT:  Head: Atraumatic.  Right Ear: External ear normal.  Left Ear: External ear normal.  Nose: Nose normal.  Mouth/Throat: Oropharynx is clear and moist.  Eyes: Conjunctivae are normal.  Neck: Neck supple.  Cardiovascular: Normal rate, regular rhythm and intact distal pulses.  Pulmonary/Chest: Effort normal and breath sounds normal. No stridor. She has no wheezes.  Abdominal: Soft.  Musculoskeletal: She exhibits no edema.  Lymphadenopathy:    She has no cervical adenopathy.  Neurological: She is alert.  Skin: No rash noted.  Psychiatric: She has a normal mood and affect.  Nursing note and vitals reviewed.    ED Treatments / Results  Labs (all labs ordered are listed, but only abnormal results are displayed) Labs Reviewed - No data to display  EKG  EKG Interpretation None       Radiology No results found.  Procedures Procedures (including critical care time)  Medications Ordered in ED Medications  albuterol (PROVENTIL, VENTOLIN) (5 MG/ML) 0.5% continuous inhalation solution (2.5 mg  Given 10/12/17 1148)  ipratropium-albuterol (DUONEB) 0.5-2.5 (3) MG/3ML nebulizer solution (3 mLs  Given 10/12/17 1148)     Initial  Impression / Assessment and Plan / ED Course  I have reviewed the triage vital signs and the nursing notes.  Pertinent labs & imaging results that were available during my care of the patient were reviewed by me and considered in my medical decision making (see chart for details).     Pulse (!) 110   Temp 98.9 F (37.2 C) (Oral)   Resp 20   Ht 5\' 4"  (1.626 m)   Wt 99.8 kg (220 lb)   LMP 09/14/2017   SpO2 100%   BMI 37.76 kg/m    Final Clinical Impressions(s) / ED Diagnoses   Final diagnoses:  Mild intermittent asthma without complication    New Prescriptions This SmartLink is deprecated. Use AVSMEDLIST instead to display the medication list for a patient.   12:22 PM Pt here with mild  asthma attack 2/2 viral URI. sxs improves with duoneb.  Pt going home with rescue inhaler and a course of steroid.  Return precaution given.    Fayrene Helperran, Phylis Javed, PA-C 10/12/17 1222    Vanetta MuldersZackowski, Scott, MD 10/15/17 [phone]

## 2017-10-12 NOTE — ED Triage Notes (Signed)
SOB, asthma x several days. Denies fever. Reports cough. No tachypnea noted. Bilateral expiratory wheezing noted.

## 2018-04-03 ENCOUNTER — Emergency Department (HOSPITAL_BASED_OUTPATIENT_CLINIC_OR_DEPARTMENT_OTHER)
Admission: EM | Admit: 2018-04-03 | Discharge: 2018-04-03 | Disposition: A | Discharge status: Home / Self Care | Payer: Medicaid Other | Attending: Emergency Medicine | Admitting: Emergency Medicine

## 2018-04-03 ENCOUNTER — Other Ambulatory Visit: Payer: Self-pay

## 2018-04-03 ENCOUNTER — Encounter (HOSPITAL_BASED_OUTPATIENT_CLINIC_OR_DEPARTMENT_OTHER): Payer: Self-pay | Admitting: *Deleted

## 2018-04-03 DIAGNOSIS — M5440 Lumbago with sciatica, unspecified side: Secondary | ICD-10-CM | POA: Insufficient documentation

## 2018-04-03 DIAGNOSIS — M545 Low back pain: Secondary | ICD-10-CM

## 2018-04-03 DIAGNOSIS — J45909 Unspecified asthma, uncomplicated: Secondary | ICD-10-CM | POA: Insufficient documentation

## 2018-04-03 LAB — PREGNANCY, URINE: PREG TEST UR: NEGATIVE

## 2018-04-03 MED ORDER — IBUPROFEN 400 MG PO TABS: 400.0000 mg | ORAL_TABLET | Freq: Four times a day (QID) | ORAL | 0 refills | Status: DC | PRN

## 2018-04-03 MED ORDER — ACETAMINOPHEN 325 MG PO TABS: 650.0000 mg | ORAL_TABLET | Freq: Four times a day (QID) | ORAL | 0 refills | Status: DC | PRN

## 2018-04-03 NOTE — ED Provider Notes (Signed)
[facility] EMERGENCY DEPARTMENT Provider Note   CSN: [id number] Arrival date & time: 04/03/18  1720     History   Chief Complaint Chief Complaint  Patient presents with  . Back Pain    for 2 days.      HPI Krista Freeman is a 27 y.o. female.  HPI   Patient is a 27 year old female presents the ED complaining of low back pain that has been present for the last 2 days.  Patient states she took a nap on the couch and was laying on her stomach 2 days ago and when she woke up she had pain in the low back.  States she has had pain like this in the past and was told that she had arthritis.  States pain is constant and severe in nature.  Is worse when she walks.  She has not tried taking anything for the pain.  Denies any recent injuries or falls.  Denies the pain radiates down her legs.  No numbness or tingling to the bilateral lower extremities.  No loss control bowel or bladder function.  No history of diabetes drug use.  No history of cancer.  No history of sweats, fevers, weight loss.  No abdominal pain, nausea, vomiting, diarrhea.  No urinary or vaginal symptoms.  Past Medical History:  Diagnosis Date  . Asthma     There are no active problems to display for this patient.   History reviewed. No pertinent surgical history.   OB History    Gravida  5   Para  4   Term      Preterm      AB  1   Living        SAB      TAB      Ectopic      Multiple      Live Births               Home Medications    Prior to Admission medications   Medication Sig Start Date End Date Taking? Authorizing Provider  predniSONE (DELTASONE) 20 MG tablet 3 tabs po day one, then 2 tabs daily x 4 days 10/12/17   Fayrene Helper, PA-C    Family History History reviewed. No pertinent family history.  Social History Social History   Tobacco Use  . Smoking status: Never Smoker  . Smokeless tobacco: Never Used  Substance Use Topics  . Alcohol use: No  . Drug use: Yes      Frequency: 7.0 times per week    Types: Marijuana    Comment: last use 3 days ago. occasiona user     Allergies   Patient has no known allergies.   Review of Systems Review of Systems  Constitutional: Negative for fever.  HENT: Negative for sore throat.   Eyes: Negative for visual disturbance.  Respiratory: Negative for shortness of breath.   Cardiovascular: Negative for chest pain.  Gastrointestinal: Negative for abdominal pain, constipation, diarrhea, nausea and vomiting.  Genitourinary: Negative for decreased urine volume, dysuria, flank pain, frequency, pelvic pain, urgency and vaginal bleeding.  Skin: Negative for wound.  Neurological: Negative for dizziness, weakness and numbness.     Physical Exam Updated Vital Signs BP (!) 126/92 (BP Location: Right Arm)   Pulse 60   Temp 98.2 F (36.8 C) (Oral)   Resp 16   Ht 5\' 4"  (1.626 m)   Wt 90.7 kg (200 lb)   LMP 03/18/2018   SpO2 100%  BMI 34.33 kg/m   Physical Exam  Constitutional: She appears well-developed and well-nourished. No distress.  Upon entering the room patient is standing next to the bed talking to her kids in no acute distress.  HENT:  Head: Normocephalic and atraumatic.  Eyes: Conjunctivae are normal.  Neck: Neck supple.  Cardiovascular: Normal rate and regular rhythm.  No murmur heard. Pulmonary/Chest: Effort normal and breath sounds normal. No respiratory distress.  Abdominal: Soft. There is no tenderness.  Musculoskeletal: She exhibits no edema.  Midline lumbar ttp and ttp along left iliac crest, no overlying erythema or ecchymosis  Neurological: She is alert.  Normal tone. 5/5 strength of BUE and BLE major muscle groups including strong and equal grip strength and dorsiflexion/plantar flexion Sensory: light touch normal in all extremities. DTRs: patellar 1+ symmetric b/l Gait: normal gait and balance. Able to walk on toes and heels with ease.  CV: 2+ radial and DP/PT pulses  Skin: Skin  is warm and dry. Capillary refill takes less than 2 seconds.  Psychiatric: She has a normal mood and affect.  Nursing note and vitals reviewed.    ED Treatments / Results  Labs (all labs ordered are listed, but only abnormal results are displayed) Labs Reviewed  PREGNANCY, URINE    EKG None  Radiology No results found.  Procedures Procedures (including critical care time)  Medications Ordered in ED Medications - No data to display   Initial Impression / Assessment and Plan / ED Course  I have reviewed the triage vital signs and the nursing notes.  Pertinent labs & imaging results that were available during my care of the patient were reviewed by me and considered in my medical decision making (see chart for details).    Final Clinical Impressions(s) / ED Diagnoses   Final diagnoses:  Acute midline low back pain, with sciatica presence unspecified   Patient with back pain.  No neurological deficits and normal neuro exam.  Patient can walk but states is painful.  No loss of bowel or bladder control.  No concern for cauda equina.  No fever, night sweats, weight loss, h/o cancer, IVDU.  RICE protocol and pain medicine indicated and discussed with patient.   ED Discharge Orders    None       Rayne DuCouture, Kalandra Masters S, PA-C 04/03/18 2308    Maia PlanLong, Joshua G, MD 04/04/18 [phone]

## 2018-04-03 NOTE — Discharge Instructions (Signed)
You may alternate taking Tylenol and Ibuprofen as needed for pain control. You may take 400-600 mg of ibuprofen every 6 hours and 500-1000 mg of Tylenol every 6 hours. Do not exceed 4000 mg of Tylenol daily as this can lead to liver damage. Also, make sure to take Ibuprofen with meals as it can cause an upset stomach. Do not take other NSAIDs while taking Ibuprofen such as (Aleve, Naprosyn, Aspirin, Celebrex, etc) and do not take more than the prescribed dose as this can lead to ulcers and bleeding in your GI tract. You may use warm and cold compresses to help with your symptoms.  ° °Please follow up with your primary doctor within the next 7-10 days for re-evaluation and further treatment of your symptoms.  ° °Return to the emergency department immediately if you experience any back pain associated with fevers, loss of control of your bowels/bladder, weakness/numbness to your legs, numbness to your groin area, inability to walk, or inability to urinate.  ° ° °

## 2018-04-03 NOTE — ED Triage Notes (Signed)
Back pain x 2 days. Pt woke up with excruciating back pain.

## 2018-11-12 ENCOUNTER — Other Ambulatory Visit: Payer: Self-pay

## 2018-11-12 ENCOUNTER — Encounter (HOSPITAL_BASED_OUTPATIENT_CLINIC_OR_DEPARTMENT_OTHER): Payer: Self-pay | Admitting: Emergency Medicine

## 2018-11-12 ENCOUNTER — Emergency Department (HOSPITAL_BASED_OUTPATIENT_CLINIC_OR_DEPARTMENT_OTHER)
Admission: EM | Admit: 2018-11-12 | Discharge: 2018-11-12 | Disposition: A | Discharge status: Home / Self Care | Payer: Self-pay | Attending: Emergency Medicine | Admitting: Emergency Medicine

## 2018-11-12 ENCOUNTER — Emergency Department (HOSPITAL_BASED_OUTPATIENT_CLINIC_OR_DEPARTMENT_OTHER): Payer: Self-pay

## 2018-11-12 DIAGNOSIS — Z23 Encounter for immunization: Secondary | ICD-10-CM | POA: Insufficient documentation

## 2018-11-12 DIAGNOSIS — J45909 Unspecified asthma, uncomplicated: Secondary | ICD-10-CM | POA: Insufficient documentation

## 2018-11-12 DIAGNOSIS — M79675 Pain in left toe(s): Secondary | ICD-10-CM | POA: Insufficient documentation

## 2018-11-12 MED ORDER — LIDOCAINE 4 % EX CREA: 1.0000 "application " | TOPICAL_CREAM | CUTANEOUS | 0 refills | Status: DC | PRN

## 2018-11-12 MED ORDER — TETANUS-DIPHTH-ACELL PERTUSSIS 5-2.5-18.5 LF-MCG/0.5 IM SUSP
0.5000 mL | Freq: Once | INTRAMUSCULAR | Status: AC
  Administered 2018-11-12: 0.5 mL via INTRAMUSCULAR
  Filled 2018-11-12: qty 0.5

## 2018-11-12 MED ORDER — DICLOFENAC SODIUM 1 % TD GEL: 2.0000 g | Freq: Four times a day (QID) | TRANSDERMAL | 0 refills | Status: DC

## 2018-11-12 NOTE — ED Triage Notes (Signed)
Pt sts there is a piece of metal in LT 2nd toe for about 2 wks; also c/o RT foot pain, but denies injury

## 2018-11-12 NOTE — ED Provider Notes (Signed)
[facility] EMERGENCY DEPARTMENT Provider Note   CSN: [id number] Arrival date & time: 11/12/18  4098     History   Chief Complaint Chief Complaint  Patient presents with  . Foot Pain    HPI Krista Freeman is a 27 y.o. female.  HPI   Krista Freeman is a 27 y.o. female, with a history of asthma, presenting to the ED with left 2nd toe pain for the last two weeks. States she thinks she got a piece of metal in her boot at work and it lodged itself into the lateral base of the left 2nd toe.  Unknown tetanus.  Denies swelling, discharge, numbness, or any other complaints.    Past Medical History:  Diagnosis Date  . Asthma     There are no active problems to display for this patient.   History reviewed. No pertinent surgical history.   OB History    Gravida  5   Para  4   Term      Preterm      AB  1   Living        SAB      TAB      Ectopic      Multiple      Live Births               Home Medications    Prior to Admission medications   Medication Sig Start Date End Date Taking? Authorizing Provider  diclofenac sodium (VOLTAREN) 1 % GEL Apply 2 g topically 4 (four) times daily. 11/12/18   Artur Winningham C, PA-C  lidocaine (LMX) 4 % cream Apply 1 application topically as needed. 11/12/18   Anselm Pancoast, PA-C    Family History No family history on file.  Social History Social History   Tobacco Use  . Smoking status: Never Smoker  . Smokeless tobacco: Never Used  Substance Use Topics  . Alcohol use: No  . Drug use: Yes    Frequency: 7.0 times per week    Types: Marijuana    Comment: last use 3 days ago. occasiona user     Allergies   Patient has no known allergies.   Review of Systems Review of Systems  Musculoskeletal: Negative for joint swelling.  Skin:       Foreign body sensation to the left second toe.  Neurological: Negative for weakness and numbness.     Physical Exam Updated Vital Signs BP 121/77   Pulse  88   Temp 98.5 F (36.9 C) (Oral)   Resp 16   Ht 5\' 4"  (1.626 m)   Wt 90.7 kg   LMP 11/05/2018   SpO2 100%   BMI 34.33 kg/m   Physical Exam  Constitutional: She appears well-developed and well-nourished. No distress.  HENT:  Head: Normocephalic and atraumatic.  Eyes: Conjunctivae are normal.  Neck: Neck supple.  Cardiovascular: Normal rate, regular rhythm and intact distal pulses.  Pulmonary/Chest: Effort normal.  Musculoskeletal:  Darkened area to the left second toe at the lateral base.  No erythema, swelling, tenderness, or exudate. No abnormality noted to the foot itself.  Neurological: She is alert.  Sensation grossly intact to light touch in the toes of the left foot. Motor function intact in each of the toes of the left foot.  Skin: Skin is warm and dry. Capillary refill takes less than 2 seconds. She is not diaphoretic. No pallor.  Psychiatric: She has a normal mood and affect. Her behavior is normal.  Nursing note and vitals reviewed.    ED Treatments / Results  Labs (all labs ordered are listed, but only abnormal results are displayed) Labs Reviewed - No data to display  EKG None  Radiology Dg Toe 2nd Left  Result Date: 11/12/2018 CLINICAL DATA:  Pt states there is a piece of metal in LT 2nd toe for about 2 wks, no other complaintsPossible foreign body EXAM: LEFT SECOND TOE COMPARISON:  None. FINDINGS: No radiodense foreign body.  No fracture dislocation. IMPRESSION: No fracture or radiodense foreign body. Electronically Signed   By: Genevive BiStewart  Edmunds M.D.   On: 11/12/2018 10:57    Procedures Procedures (including critical care time)  Medications Ordered in ED Medications  Tdap (BOOSTRIX) injection 0.5 mL (0.5 mLs Intramuscular Given 11/12/18 1055)     Initial Impression / Assessment and Plan / ED Course  I have reviewed the triage vital signs and the nursing notes.  Pertinent labs & imaging results that were available during my care of the patient  were reviewed by me and considered in my medical decision making (see chart for details).     Patient presents with pain to the left second toe.  No x-ray findings of acute abnormalities.  We will try topical treatments.  Podiatry follow-up. The patient was given instructions for home care as well as return precautions. Patient voices understanding of these instructions, accepts the plan, and is comfortable with discharge.    Final Clinical Impressions(s) / ED Diagnoses   Final diagnoses:  Toe pain, left    ED Discharge Orders         Ordered    diclofenac sodium (VOLTAREN) 1 % GEL  4 times daily     11/12/18 1159    lidocaine (LMX) 4 % cream  As needed     11/12/18 1159           Anselm PancoastJoy, Ayiden Milliman C, PA-C 11/12/18 1445    Jacalyn LefevreHaviland, Julie, MD 11/16/18 [phone]

## 2018-11-12 NOTE — Discharge Instructions (Addendum)
Keep your feet clean and as dry as possible.  This may include changing your socks during the day, taking off your boots as soon as possible at the end of the day, and applying drying powders or cornstarch.  May apply the diclofenac gel, as needed, for pain and inflammation relief.  Alternatively, may apply the lidocaine gel for pain relief. Hydrocortisone may also be tried.  Follow-up with a primary care provider or with the podiatrist on this matter.

## 2019-02-14 ENCOUNTER — Emergency Department (HOSPITAL_BASED_OUTPATIENT_CLINIC_OR_DEPARTMENT_OTHER)
Admission: EM | Admit: 2019-02-14 | Discharge: 2019-02-14 | Disposition: A | Discharge status: Home / Self Care | Payer: Self-pay | Attending: Emergency Medicine | Admitting: Emergency Medicine

## 2019-02-14 ENCOUNTER — Other Ambulatory Visit: Payer: Self-pay

## 2019-02-14 ENCOUNTER — Encounter (HOSPITAL_BASED_OUTPATIENT_CLINIC_OR_DEPARTMENT_OTHER): Payer: Self-pay | Admitting: Emergency Medicine

## 2019-02-14 DIAGNOSIS — K0889 Other specified disorders of teeth and supporting structures: Secondary | ICD-10-CM | POA: Insufficient documentation

## 2019-02-14 DIAGNOSIS — K029 Dental caries, unspecified: Secondary | ICD-10-CM | POA: Insufficient documentation

## 2019-02-14 DIAGNOSIS — J45909 Unspecified asthma, uncomplicated: Secondary | ICD-10-CM | POA: Insufficient documentation

## 2019-02-14 LAB — PREGNANCY, URINE: Preg Test, Ur: NEGATIVE

## 2019-02-14 MED ORDER — PENICILLIN V POTASSIUM 500 MG PO TABS: 500.0000 mg | ORAL_TABLET | Freq: Four times a day (QID) | ORAL | 0 refills | Status: AC

## 2019-02-14 MED ORDER — PENICILLIN V POTASSIUM 250 MG PO TABS
500.0000 mg | ORAL_TABLET | Freq: Once | ORAL | Status: AC
  Administered 2019-02-14: 500 mg via ORAL
  Filled 2019-02-14: qty 2

## 2019-02-14 MED ORDER — NAPROXEN 500 MG PO TABS: 500.0000 mg | ORAL_TABLET | Freq: Two times a day (BID) | ORAL | 0 refills | Status: AC | PRN

## 2019-02-14 MED ORDER — NAPROXEN 500 MG PO TABS: 500.0000 mg | ORAL_TABLET | Freq: Two times a day (BID) | ORAL | 0 refills | Status: DC | PRN

## 2019-02-14 MED ORDER — ACETAMINOPHEN 325 MG PO TABS
650.0000 mg | ORAL_TABLET | Freq: Once | ORAL | Status: AC
  Administered 2019-02-14: 650 mg via ORAL
  Filled 2019-02-14: qty 2

## 2019-02-14 MED ORDER — PENICILLIN V POTASSIUM 500 MG PO TABS: 500.0000 mg | ORAL_TABLET | Freq: Four times a day (QID) | ORAL | 0 refills | Status: DC

## 2019-02-14 NOTE — ED Triage Notes (Signed)
C/o R upper jaw that started tonight. States she has known broken tooth in that area. Reports taking "2 Ibuprofen", and denies fever. Pt states she does have a dentist for f/u. Tearful during triage.

## 2019-02-14 NOTE — ED Provider Notes (Signed)
[facility] EMERGENCY DEPARTMENT Provider Note   CSN: [id number] Arrival date & time: 02/14/19  0435    History   Chief Complaint Chief Complaint  Patient presents with  . Dental Pain    HPI Krista Freeman is a 28 y.o. female.     Patient presents with right upper incisor pain that woke her from sleep several hours ago.  States she knows this tooth is broken but it does not usually cause her pain until tonight.  She took some ibuprofen at home without relief.  Denies difficulty breathing, difficulty swallowing, fever or vomiting.  No chest pain or shortness of breath.  States she does have a dentist for follow-up but does not know his name. Told the nurse she is not certain if she is pregnant as she is late on her menses.  The history is provided by the patient.  Dental Pain  Associated symptoms: no headaches     Past Medical History:  Diagnosis Date  . Asthma     There are no active problems to display for this patient.   History reviewed. No pertinent surgical history.   OB History    Gravida  5   Para  4   Term      Preterm      AB  1   Living        SAB      TAB      Ectopic      Multiple      Live Births               Home Medications    Prior to Admission medications   Medication Sig Start Date End Date Taking? Authorizing Provider  diclofenac sodium (VOLTAREN) 1 % GEL Apply 2 g topically 4 (four) times daily. 11/12/18   Joy, Shawn C, PA-C  lidocaine (LMX) 4 % cream Apply 1 application topically as needed. 11/12/18   Anselm Pancoast, PA-C    Family History No family history on file.  Social History Social History   Tobacco Use  . Smoking status: Never Smoker  . Smokeless tobacco: Never Used  Substance Use Topics  . Alcohol use: No  . Drug use: Yes    Frequency: 7.0 times per week    Types: Marijuana    Comment: last use 3 days ago. occasiona user     Allergies   Patient has no known allergies.   Review of  Systems Review of Systems  Constitutional: Negative for activity change and appetite change.  HENT: Positive for dental problem. Negative for sinus pressure and sinus pain.   Respiratory: Negative for cough and shortness of breath.   Gastrointestinal: Negative for abdominal pain, nausea and vomiting.  Genitourinary: Negative for dysuria.  Musculoskeletal: Negative for arthralgias and myalgias.  Neurological: Negative for dizziness, weakness and headaches.    all other systems are negative except as noted in the HPI and PMH.    Physical Exam Updated Vital Signs BP (!) 154/105 (BP Location: Right Arm)   Pulse 85   Temp 98.3 F (36.8 C) (Oral)   Resp 20   Ht 5\' 4"  (1.626 m)   Wt 99.8 kg   LMP 01/09/2019   SpO2 100%   BMI 37.76 kg/m   Physical Exam Vitals signs and nursing note reviewed.  Constitutional:      General: She is not in acute distress.    Appearance: She is well-developed.  HENT:     Head: Normocephalic  and atraumatic.     Mouth/Throat:     Pharynx: No oropharyngeal exudate.     Comments: Right upper incisor with large caries, no fluctuance. No abscess.  Floor mouth is soft.  False teeth on lower jaw Eyes:     Conjunctiva/sclera: Conjunctivae normal.     Pupils: Pupils are equal, round, and reactive to light.  Neck:     Musculoskeletal: Normal range of motion and neck supple.     Comments: No meningismus. Cardiovascular:     Rate and Rhythm: Normal rate and regular rhythm.     Heart sounds: Normal heart sounds. No murmur.  Pulmonary:     Effort: Pulmonary effort is normal. No respiratory distress.     Breath sounds: Normal breath sounds.  Abdominal:     Palpations: Abdomen is soft.     Tenderness: There is no abdominal tenderness. There is no guarding or rebound.  Musculoskeletal: Normal range of motion.        General: No tenderness.  Skin:    General: Skin is warm.  Neurological:     Mental Status: She is alert and oriented to person, place, and  time.     Cranial Nerves: No cranial nerve deficit.     Motor: No abnormal muscle tone.     Coordination: Coordination normal.     Comments: No ataxia on finger to nose bilaterally. No pronator drift. 5/5 strength throughout. CN 2-12 intact.Equal grip strength. Sensation intact.   Psychiatric:        Behavior: Behavior normal.      ED Treatments / Results  Labs (all labs ordered are listed, but only abnormal results are displayed) Labs Reviewed  PREGNANCY, URINE    EKG None  Radiology No results found.  Procedures Procedures (including critical care time)  Medications Ordered in ED Medications - No data to display   Initial Impression / Assessment and Plan / ED Course  I have reviewed the triage vital signs and the nursing notes.  Pertinent labs & imaging results that were available during my care of the patient were reviewed by me and considered in my medical decision making (see chart for details).       Dental care without evidence of abscess or Ludwig angina.  No difficulty breathing or difficulty swallowing.  We will check pregnancy test to assist with medication management.  Pregnancy test negative.  Will treat with nonnarcotic pain medication and antibiotics. Follow-up with dentistry.  Return precautions discussed.  Final Clinical Impressions(s) / ED Diagnoses   Final diagnoses:  Pain, dental    ED Discharge Orders    None       Nargis Abrams, Jeannett Senior, MD 02/14/19 [phone]

## 2019-02-14 NOTE — Discharge Instructions (Addendum)
Take the antibiotics and follow-up with your dentist or 1 of the attached list.  Return to the ED with difficulty breathing, difficulty swallowing, any other concerns.

## 2019-06-13 ENCOUNTER — Emergency Department (HOSPITAL_BASED_OUTPATIENT_CLINIC_OR_DEPARTMENT_OTHER)
Admission: EM | Admit: 2019-06-13 | Discharge: 2019-06-13 | Disposition: A | Discharge status: Home / Self Care | Payer: Self-pay | Attending: Emergency Medicine | Admitting: Emergency Medicine

## 2019-06-13 ENCOUNTER — Other Ambulatory Visit: Payer: Self-pay

## 2019-06-13 ENCOUNTER — Encounter (HOSPITAL_BASED_OUTPATIENT_CLINIC_OR_DEPARTMENT_OTHER): Payer: Self-pay | Admitting: *Deleted

## 2019-06-13 DIAGNOSIS — J45901 Unspecified asthma with (acute) exacerbation: Secondary | ICD-10-CM | POA: Insufficient documentation

## 2019-06-13 DIAGNOSIS — Z77120 Contact with and (suspected) exposure to mold (toxic): Secondary | ICD-10-CM | POA: Insufficient documentation

## 2019-06-13 MED ORDER — ALBUTEROL SULFATE HFA 108 (90 BASE) MCG/ACT IN AERS
1.0000 | INHALATION_SPRAY | RESPIRATORY_TRACT | Status: DC | PRN
  Administered 2019-06-13: 2 via RESPIRATORY_TRACT
  Filled 2019-06-13: qty 6.7

## 2019-06-13 MED ORDER — PREDNISONE 20 MG PO TABS: ORAL_TABLET | ORAL | 0 refills | Status: DC

## 2019-06-13 NOTE — Discharge Instructions (Addendum)
You need to have your home cleaned and all mold removed.

## 2019-06-13 NOTE — ED Provider Notes (Signed)
[facility] EMERGENCY DEPARTMENT Provider Note   CSN: [id number] Arrival date & time: 06/13/19  1920     History   Chief Complaint Chief Complaint  Patient presents with  . Cough    HPI Krista Freeman is a 28 y.o. female.     HPI Patient has history of asthma.  States over the last 1 to 2 weeks has had increased wheezing and nonproductive cough.  No fever or chills.  Patient concerned is concern for some mold that is growing in her children's room that this may be contributing to her symptoms.  Her children are having similar symptoms.  No other known exposures.  No new rashes. Past Medical History:  Diagnosis Date  . Asthma     There are no active problems to display for this patient.   History reviewed. No pertinent surgical history.   OB History    Gravida  5   Para  4   Term      Preterm      AB  1   Living        SAB      TAB      Ectopic      Multiple      Live Births               Home Medications    Prior to Admission medications   Medication Sig Start Date End Date Taking? Authorizing Provider  diclofenac sodium (VOLTAREN) 1 % GEL Apply 2 g topically 4 (four) times daily. 11/12/18   Joy, Shawn C, PA-C  lidocaine (LMX) 4 % cream Apply 1 application topically as needed. 11/12/18   Joy, Shawn C, PA-C  naproxen (NAPROSYN) 500 MG tablet Take 1 tablet (500 mg total) by mouth 2 (two) times daily as needed. 02/14/19   Rancour, Jeannett SeniorStephen, MD  predniSONE (DELTASONE) 20 MG tablet 3 tabs po day one, then 2 po daily x 4 days 06/13/19   Loren RacerYelverton, Amauri Medellin, MD    Family History No family history on file.  Social History Social History   Tobacco Use  . Smoking status: Never Smoker  . Smokeless tobacco: Never Used  Substance Use Topics  . Alcohol use: No  . Drug use: Not Currently    Frequency: 7.0 times per week    Types: Marijuana    Comment: last use 3 days ago. occasiona user     Allergies   Patient has no known allergies.    Review of Systems Review of Systems  Constitutional: Negative for chills and fever.  HENT: Negative for congestion, sinus pressure, sore throat and trouble swallowing.   Respiratory: Positive for cough and wheezing. Negative for chest tightness and shortness of breath.   Cardiovascular: Negative for chest pain.  Gastrointestinal: Negative for abdominal pain, constipation, diarrhea, nausea and vomiting.  Musculoskeletal: Negative for back pain, myalgias and neck pain.  Skin: Negative for rash.  Neurological: Negative for dizziness, weakness, light-headedness, numbness and headaches.  All other systems reviewed and are negative.    Physical Exam Updated Vital Signs BP 117/89 (BP Location: Right Arm)   Pulse 92   Temp 99.1 F (37.3 C) (Oral)   Resp 18   Ht 5\' 4"  (1.626 m)   Wt 99.9 kg   LMP 06/03/2019 (Approximate)   SpO2 98%   Breastfeeding No   BMI 37.80 kg/m   Physical Exam Vitals signs and nursing note reviewed.  Constitutional:      Appearance: Normal appearance. She is  well-developed.  HENT:     Head: Normocephalic and atraumatic.     Nose: Nose normal. No congestion.     Mouth/Throat:     Mouth: Mucous membranes are moist.  Eyes:     Pupils: Pupils are equal, round, and reactive to light.  Neck:     Musculoskeletal: Normal range of motion and neck supple. No neck rigidity or muscular tenderness.  Cardiovascular:     Rate and Rhythm: Normal rate and regular rhythm.     Heart sounds: No murmur. No friction rub. No gallop.   Pulmonary:     Effort: Pulmonary effort is normal.     Breath sounds: Wheezing present.     Comments: Expiratory wheezing throughout.  No respiratory distress. Abdominal:     General: Bowel sounds are normal.     Palpations: Abdomen is soft.     Tenderness: There is no abdominal tenderness. There is no guarding or rebound.  Musculoskeletal: Normal range of motion.        General: No swelling, tenderness, deformity or signs of injury.      Right lower leg: No edema.  Lymphadenopathy:     Cervical: No cervical adenopathy.  Skin:    General: Skin is warm and dry.     Capillary Refill: Capillary refill takes less than 2 seconds.     Findings: No erythema or rash.  Neurological:     General: No focal deficit present.     Mental Status: She is alert and oriented to person, place, and time.  Psychiatric:        Behavior: Behavior normal.      ED Treatments / Results  Labs (all labs ordered are listed, but only abnormal results are displayed) Labs Reviewed - No data to display  EKG None  Radiology No results found.  Procedures Procedures (including critical care time)  Medications Ordered in ED Medications  albuterol (VENTOLIN HFA) 108 (90 Base) MCG/ACT inhaler 1-2 puff (has no administration in time range)     Initial Impression / Assessment and Plan / ED Course  I have reviewed the triage vital signs and the nursing notes.  Pertinent labs & imaging results that were available during my care of the patient were reviewed by me and considered in my medical decision making (see chart for details).        Advised to have mold professionally removed from home..  Will give short course of prednisone.  Also given albuterol inhaler in the emergency department.  Final Clinical Impressions(s) / ED Diagnoses   Final diagnoses:  Moderate asthma with exacerbation, unspecified whether persistent  Mold exposure    ED Discharge Orders         Ordered    predniSONE (DELTASONE) 20 MG tablet     06/13/19 2017           Julianne Rice, MD 06/13/19 2027

## 2019-06-13 NOTE — ED Triage Notes (Signed)
Cough for several weeks. Hx of asthma. Concerned for mold exposure. Reports feeling SOB. She has been using home neb Tx

## 2019-06-13 NOTE — Progress Notes (Signed)
RN administered albuterol MDI treatment. 

## 2019-07-26 ENCOUNTER — Emergency Department (HOSPITAL_BASED_OUTPATIENT_CLINIC_OR_DEPARTMENT_OTHER): Payer: Self-pay

## 2019-07-26 ENCOUNTER — Emergency Department (HOSPITAL_BASED_OUTPATIENT_CLINIC_OR_DEPARTMENT_OTHER)
Admission: EM | Admit: 2019-07-26 | Discharge: 2019-07-26 | Discharge status: Against Medical Advice | Payer: Self-pay | Attending: Emergency Medicine | Admitting: Emergency Medicine

## 2019-07-26 ENCOUNTER — Other Ambulatory Visit: Payer: Self-pay

## 2019-07-26 ENCOUNTER — Encounter (HOSPITAL_BASED_OUTPATIENT_CLINIC_OR_DEPARTMENT_OTHER): Payer: Self-pay | Admitting: *Deleted

## 2019-07-26 DIAGNOSIS — M79606 Pain in leg, unspecified: Secondary | ICD-10-CM | POA: Insufficient documentation

## 2019-07-26 DIAGNOSIS — J189 Pneumonia, unspecified organism: Secondary | ICD-10-CM | POA: Insufficient documentation

## 2019-07-26 DIAGNOSIS — R079 Chest pain, unspecified: Secondary | ICD-10-CM

## 2019-07-26 LAB — COMPREHENSIVE METABOLIC PANEL
ALT: 22 U/L (ref 0–44)
AST: 29 U/L (ref 15–41)
Albumin: 3.7 g/dL (ref 3.5–5.0)
Alkaline Phosphatase: 82 U/L (ref 38–126)
Anion gap: 6 (ref 5–15)
BUN: 10 mg/dL (ref 6–20)
CO2: 26 mmol/L (ref 22–32)
Calcium: 8.7 mg/dL — ABNORMAL LOW (ref 8.9–10.3)
Chloride: 106 mmol/L (ref 98–111)
Creatinine, Ser: 0.76 mg/dL (ref 0.44–1.00)
GFR calc Af Amer: >60 mL/min (ref >60)
GFR calc non Af Amer: >60 mL/min (ref >60)
Glucose, Bld: 74 mg/dL (ref 70–99)
Potassium: 3.2 mmol/L — ABNORMAL LOW (ref 3.5–5.1)
Sodium: 138 mmol/L (ref 135–145)
Total Bilirubin: 0.5 mg/dL (ref 0.3–1.2)
Total Protein: 7.7 g/dL (ref 6.5–8.1)

## 2019-07-26 LAB — CBC WITH DIFFERENTIAL/PLATELET
Abs Immature Granulocytes: 0.03 10*3/uL (ref 0.00–0.07)
Basophils Absolute: 0 10*3/uL (ref 0.0–0.1)
Basophils Relative: 0 %
Eosinophils Absolute: 0.3 10*3/uL (ref 0.0–0.5)
Eosinophils Relative: 4 %
HCT: 37.5 % (ref 36.0–46.0)
Hemoglobin: 12.2 g/dL (ref 12.0–15.0)
Immature Granulocytes: 1 %
Lymphocytes Relative: 32 %
Lymphs Abs: 2.1 10*3/uL (ref 0.7–4.0)
MCH: 25.5 pg — ABNORMAL LOW (ref 26.0–34.0)
MCHC: 32.5 g/dL (ref 30.0–36.0)
MCV: 78.3 fL — ABNORMAL LOW (ref 80.0–100.0)
Monocytes Absolute: 0.3 10*3/uL (ref 0.1–1.0)
Monocytes Relative: 5 %
Neutro Abs: 3.8 10*3/uL (ref 1.7–7.7)
Neutrophils Relative %: 58 %
Platelets: 269 10*3/uL (ref 150–400)
RBC: 4.79 MIL/uL (ref 3.87–5.11)
RDW: 14.2 % (ref 11.5–15.5)
WBC: 6.6 10*3/uL (ref 4.0–10.5)
nRBC: 0 % (ref 0.0–0.2)

## 2019-07-26 LAB — TROPONIN I (HIGH SENSITIVITY)
Troponin I (High Sensitivity): <2 ng/L (ref <18)
Troponin I (High Sensitivity): 2 ng/L (ref <18)

## 2019-07-26 LAB — D-DIMER, QUANTITATIVE (NOT AT ARMC): D-Dimer, Quant: 1.06 ug/mL-FEU — ABNORMAL HIGH (ref 0.00–0.50)

## 2019-07-26 LAB — PREGNANCY, URINE: Preg Test, Ur: NEGATIVE

## 2019-07-26 LAB — LIPASE, BLOOD: Lipase: 26 U/L (ref 11–51)

## 2019-07-26 MED ORDER — IOHEXOL 350 MG/ML SOLN
100.0000 mL | Freq: Once | INTRAVENOUS | Status: AC
  Administered 2019-07-26: 100 mL via INTRAVENOUS

## 2019-07-26 NOTE — ED Triage Notes (Signed)
C/o right rib pain with movt x 1 day

## 2019-07-26 NOTE — ED Notes (Signed)
Spoke with pt on the phone  States she left the hospital  States she still has her IV in place  Instructed pt to come back so we can remove her IV  Pt states she will be back in about 10 minutes to get her IV removed

## 2019-07-26 NOTE — ED Notes (Signed)
Patient returned to have IV removed. IV successfully removed. Patient left.

## 2019-07-26 NOTE — ED Notes (Signed)
Pt in radiology 

## 2019-07-26 NOTE — ED Notes (Signed)
Went to check on patient in room 3 for rounding. Found patient had left without speaking to staff. Blanket found in chair indicative of visitor with patient. However, no evidence of IV removal found in room. Notified charge nurse of findings.

## 2019-07-26 NOTE — ED Provider Notes (Signed)
[facility] EMERGENCY DEPARTMENT Provider Note   CSN: [id number] Arrival date & time: 07/26/19  1604     History   Chief Complaint Chief Complaint  Patient presents with   Chest Pain    HPI Krista Freeman is a 28 y.o. female.     The history is provided by the patient and medical records. No language interpreter was used.  Chest Pain Pain location:  R chest and R lateral chest Pain quality: sharp   Pain radiates to:  Does not radiate Pain severity:  Severe Onset quality:  Gradual Duration:  2 days Timing:  Constant Progression:  Waxing and waning Chronicity:  New Relieved by:  Nothing Worsened by:  Movement, deep breathing, coughing and certain positions Ineffective treatments:  None tried Associated symptoms: no abdominal pain, no back pain, no cough, no diaphoresis, no dysphagia, no fatigue, no fever, no headache, no lower extremity edema, no nausea, no palpitations, no shortness of breath and no vomiting     Past Medical History:  Diagnosis Date   Asthma     There are no active problems to display for this patient.   History reviewed. No pertinent surgical history.   OB History    Gravida  5   Para  4   Term      Preterm      AB  1   Living        SAB      TAB      Ectopic      Multiple      Live Births               Home Medications    Prior to Admission medications   Medication Sig Start Date End Date Taking? Authorizing Provider  naproxen (NAPROSYN) 500 MG tablet Take 1 tablet (500 mg total) by mouth 2 (two) times daily as needed. 02/14/19   Glynn Octaveancour, Stephen, MD    Family History History reviewed. No pertinent family history.  Social History Social History   Tobacco Use   Smoking status: Never Smoker   Smokeless tobacco: Never Used  Substance Use Topics   Alcohol use: No   Drug use: Not Currently    Frequency: 7.0 times per week    Types: Marijuana    Comment: last use 3 days ago. occasiona user      Allergies   Patient has no known allergies.   Review of Systems Review of Systems  Constitutional: Negative for chills, diaphoresis, fatigue and fever.  HENT: Negative for congestion, ear pain, sore throat and trouble swallowing.   Eyes: Negative for photophobia, pain and visual disturbance.  Respiratory: Negative for cough, chest tightness, shortness of breath, wheezing and stridor.   Cardiovascular: Positive for chest pain. Negative for palpitations and leg swelling.  Gastrointestinal: Negative for abdominal pain, constipation, diarrhea, nausea and vomiting.  Genitourinary: Negative for dysuria, flank pain, frequency and hematuria.  Musculoskeletal: Negative for arthralgias, back pain, neck pain and neck stiffness.  Skin: Negative for color change and rash.  Neurological: Negative for seizures, syncope, light-headedness and headaches.  Psychiatric/Behavioral: Negative for agitation.  All other systems reviewed and are negative.    Physical Exam Updated Vital Signs BP 135/86 (BP Location: Right Arm)    Pulse 78    Temp 98.8 F (37.1 C) (Oral)    Resp 18    Ht 5\' 4"  (1.626 m)    Wt 99.8 kg    LMP 07/09/2019    SpO2 100%  BMI 37.76 kg/m   Physical Exam Vitals signs and nursing note reviewed.  Constitutional:      General: She is not in acute distress.    Appearance: She is well-developed. She is not ill-appearing, toxic-appearing or diaphoretic.  HENT:     Head: Normocephalic and atraumatic.     Right Ear: External ear normal.     Left Ear: External ear normal.     Nose: Nose normal.     Mouth/Throat:     Pharynx: No oropharyngeal exudate.  Eyes:     Conjunctiva/sclera: Conjunctivae normal.     Pupils: Pupils are equal, round, and reactive to light.  Neck:     Musculoskeletal: Normal range of motion and neck supple.  Cardiovascular:     Rate and Rhythm: Normal rate.     Heart sounds: Normal heart sounds. Heart sounds not distant. No murmur.  Pulmonary:      Effort: No respiratory distress.     Breath sounds: No stridor. Wheezing present. No decreased breath sounds, rhonchi or rales.  Chest:     Chest wall: Tenderness present.    Abdominal:     General: There is no distension.     Palpations: Abdomen is soft.     Tenderness: There is no abdominal tenderness. There is no rebound.  Musculoskeletal:     Right lower leg: She exhibits no tenderness. No edema.     Left lower leg: She exhibits no tenderness. No edema.  Skin:    General: Skin is warm.     Capillary Refill: Capillary refill takes less than 2 seconds.     Findings: No erythema or rash.  Neurological:     Mental Status: She is alert and oriented to person, place, and time.     Motor: No abnormal muscle tone.     Coordination: Coordination normal.     Deep Tendon Reflexes: Reflexes are normal and symmetric.  Psychiatric:        Mood and Affect: Mood normal.      ED Treatments / Results  Labs (all labs ordered are listed, but only abnormal results are displayed) Labs Reviewed  CBC WITH DIFFERENTIAL/PLATELET - Abnormal; Notable for the following components:      Result Value   MCV 78.3 (*)    MCH 25.5 (*)    All other components within normal limits  COMPREHENSIVE METABOLIC PANEL - Abnormal; Notable for the following components:   Potassium 3.2 (*)    Calcium 8.7 (*)    All other components within normal limits  D-DIMER, QUANTITATIVE (NOT AT [facility]) - Abnormal; Notable for the following components:   D-Dimer, Quant 1.06 (*)    All other components within normal limits  LIPASE, BLOOD  PREGNANCY, URINE  TROPONIN I (HIGH SENSITIVITY)  TROPONIN I (HIGH SENSITIVITY)    EKG None  Radiology Dg Chest 2 View  Result Date: 07/26/2019 CLINICAL DATA:  Pain under right breast x 2 days; H/O asthma EXAM: CHEST - 2 VIEW COMPARISON:  None. FINDINGS: The heart size and mediastinal contours are within normal limits. Both lungs are clear. The visualized skeletal structures are  unremarkable. IMPRESSION: No active cardiopulmonary disease. Electronically Signed   By: Norva Pavlov M.D.   On: 07/26/2019 17:56   Ct Angio Chest Pe W And/or Wo Contrast  Result Date: 07/26/2019 CLINICAL DATA:  Complaints of right-sided rib pain and chest pain. Positive D-dimer. EXAM: CT ANGIOGRAPHY CHEST WITH CONTRAST TECHNIQUE: Multidetector CT imaging of the chest was performed using the  standard protocol during bolus administration of intravenous contrast. Multiplanar CT image reconstructions and MIPs were obtained to evaluate the vascular anatomy. CONTRAST:  A total of 175 mL OMNIPAQUE IOHEXOL 350 MG/ML SOLN was administered across 2 scans. COMPARISON:  None. FINDINGS: Cardiovascular: Evaluation is limited by suboptimal contrast bolus timing. There is no large centrally located pulmonary embolus. Detection of smaller segmental and subsegmental pulmonary emboli is limited. There is some mild motion artifact which limits evaluation. The heart size is normal. The aorta is unremarkable. There is no significant pericardial effusion. Mediastinum/Nodes: --there is a prominent right hilar lymph node measuring 1.5 x 2 cm. There are no additional enlarged mediastinal or hilar lymph nodes. --there are some prominent but morphologically normal appearing bilateral axillary lymph nodes. --No supraclavicular lymphadenopathy. --Normal thyroid gland. --The esophagus is unremarkable Lungs/Pleura: There is airspace consolidation involving the right upper lobe measuring approximately 3.6 cm across. This consolidation is located medially in the right suprahilar region. There is presumed atelectasis involving the medial left lower lobe. There is no pneumothorax. No pleural effusion. Upper Abdomen: There is appears to be mild right-sided collecting system dilatation. Musculoskeletal: There is no displaced fracture. Two left-sided breast nodules are noted. These are not well characterized on this examination. The largest  measures approximately 2.2 x 1.7 cm. More superiorly in the subareolar region there is a 1.1 by 0.7 cm nodule. Review of the MIP images confirms the above findings. IMPRESSION: 1. Evaluation for pulmonary emboli is limited by suboptimal contrast bolus timing. Given this limitation, there is no large centrally located pulmonary embolus detected on this exam. 2. Right upper lobe consolidation in the medial suprahilar region is concerning for pneumonia in the appropriate clinical setting. There is a smaller opacity in the medial left lower lobe immediately adjacent to the descending aorta. This could represent an additional infiltrate or atelectasis. 3. Left-sided breast nodules as detailed above. Follow-up with dedicated outpatient breast imaging is recommended for further evaluation as these are incompletely evaluated on this exam. 4. Mildly enlarged right hilar lymph node, presumably reactive in etiology. 5. Possible mild right-sided renal collecting system dilatation. If there is clinical concern for renal pathology, follow-up with ultrasound is recommended for further evaluation. Electronically Signed   By: Katherine Mantlehristopher  Green M.D.   On: 07/26/2019 20:20   [place] Venous Img Lower Bilateral  Result Date: 07/26/2019 CLINICAL DATA:  Leg pain. Positive D-dimer. The patient is status post a long drive. EXAM: BILATERAL LOWER EXTREMITY VENOUS DOPPLER ULTRASOUND TECHNIQUE: Gray-scale sonography with graded compression, as well as color Doppler and duplex ultrasound were performed to evaluate the lower extremity deep venous systems from the level of the common femoral vein and including the common femoral, femoral, profunda femoral, popliteal and calf veins including the posterior tibial, peroneal and gastrocnemius veins when visible. The superficial great saphenous vein was also interrogated. Spectral Doppler was utilized to evaluate flow at rest and with distal augmentation maneuvers in the common femoral, femoral and  popliteal veins. COMPARISON:  None. FINDINGS: RIGHT LOWER EXTREMITY Common Femoral Vein: No evidence of thrombus. Normal compressibility, respiratory phasicity and response to augmentation. Saphenofemoral Junction: No evidence of thrombus. Normal compressibility and flow on color Doppler imaging. Profunda Femoral Vein: No evidence of thrombus. Normal compressibility and flow on color Doppler imaging. Femoral Vein: No evidence of thrombus. Normal compressibility, respiratory phasicity and response to augmentation. Popliteal Vein: No evidence of thrombus. Normal compressibility, respiratory phasicity and response to augmentation. Calf Veins: No evidence of thrombus. Normal compressibility and flow on  color Doppler imaging. Superficial Great Saphenous Vein: No evidence of thrombus. Normal compressibility. Venous Reflux:  None. Other Findings:  None. LEFT LOWER EXTREMITY Common Femoral Vein: No evidence of thrombus. Normal compressibility, respiratory phasicity and response to augmentation. Saphenofemoral Junction: No evidence of thrombus. Normal compressibility and flow on color Doppler imaging. Profunda Femoral Vein: No evidence of thrombus. Normal compressibility and flow on color Doppler imaging. Femoral Vein: No evidence of thrombus. Normal compressibility, respiratory phasicity and response to augmentation. Popliteal Vein: No evidence of thrombus. Normal compressibility, respiratory phasicity and response to augmentation. Calf Veins: No evidence of thrombus. Normal compressibility and flow on color Doppler imaging. Superficial Great Saphenous Vein: No evidence of thrombus. Normal compressibility. Venous Reflux:  None. Other Findings:  None. IMPRESSION: No evidence of deep venous thrombosis in either lower extremity. Electronically Signed   By: Constance Holster M.D.   On: 07/26/2019 20:21    Procedures Procedures (including critical care time)  Medications Ordered in ED Medications  iohexol (OMNIPAQUE)  350 MG/ML injection 100 mL (100 mLs Intravenous Contrast Given 07/26/19 1915)     Initial Impression / Assessment and Plan / ED Course  I have reviewed the triage vital signs and the nursing notes.  Pertinent labs & imaging results that were available during my care of the patient were reviewed by me and considered in my medical decision making (see chart for details).        Krista Freeman is a 28 y.o. female with a past medical history significant for asthma who presents with right-sided chest pain.  Patient reports that she has had chest pain for the last 2 days in her right lateral inferior chest.  She reports it is worse with deep breathing and with twisting and palpation.  She denies any fevers, chills, congestion, or cough.  She denies other anterior chest pain.  She reports that she has had some bilateral leg pains for the last week or so and does report that she recently traveled to [place] in the car.  She denies a history of DVT or PE and does not use birth control medications.  She denies any nausea, vomiting, urinary symptoms or other GI symptoms.  No other complaints.  She describes the pain is up to an 8 out of 10 severity is worse but is now approximately 6 out of 10.  On exam, right lateral inferior chest is tender to palpation.  Lungs had some faint wheezing but otherwise were reassuring.  No murmur or anterior chest pain.  Legs were nontender and nonedematous on my exam.  No significant low back tenderness.  Clinically I suspect patient has musculoskeletal pain causing her symptoms as her pain is worsened with deep breathing or twisting or palpation however with her report that she has had some leg pain after a long trip and the pleuritic chest pain, patient will have a d-dimer, chest x-ray, and screening labs.  If work-up is reassuring, anticipate discharge home.  Patient's work-up showed no evidence of DVT in the legs.  Chest x-ray showed no abnormality however CT PE  study which was ordered due to elevated d-dimer showed concern for possible pneumonia.  There was also evidence of some breast nodule which patient will need outpatient follow-up for.  While awaiting results to return, patient eloped from the emergency department.  Will attempt to call patient to tell her about the pneumonia discovered.  12:58 AM Just spoke with patient on the phone who agrees to pick up a prescription for  antibiotics for possible pneumonia.  She was also informed of the nodules on imaging that she can follow-up for.  Anticipate outpatient follow-up and return precautions were understood.     Final Clinical Impressions(s) / ED Diagnoses   Final diagnoses:  Community acquired pneumonia, unspecified laterality  Chest pain, unspecified type    ED Discharge Orders    None      Clinical Impression: 1. Community acquired pneumonia, unspecified laterality   2. Chest pain, unspecified type     Disposition: Eloped  Condition: Good     Discharge Medication List as of 07/27/2019  1:02 AM      [facility]: [facility] [address] [place] [id number] [phone] Schedule an appointment as soon as possible for a visit    [facility] [address] [id number] HQ [id number] [address] [id number] [phone]       Borna Wessinger, Canary Brimhristopher J, MD 07/27/19 [phone]

## 2019-07-27 ENCOUNTER — Telehealth (HOSPITAL_BASED_OUTPATIENT_CLINIC_OR_DEPARTMENT_OTHER): Payer: Self-pay | Admitting: Emergency Medicine

## 2019-07-27 MED ORDER — DOXYCYCLINE HYCLATE 100 MG PO CAPS: 100.0000 mg | ORAL_CAPSULE | Freq: Two times a day (BID) | ORAL | 0 refills | Status: AC

## 2019-07-27 MED FILL — DOXYCYCLINE HYCLATE 100 MG: 100 | 7 days supply | Qty: 14 | Fill #0

## 2019-07-27 NOTE — Telephone Encounter (Signed)
1:04 AM Patient was called and informed that her CT study revealed evidence of pneumonia.  Patient agreed to pick up prescription for antibiotics and will follow-up with PCP for further work-up of the nodules discovered.  She understands return precautions and plan of care.  Patient will continue outpatient management.

## 2019-07-27 NOTE — Discharge Instructions (Signed)
Your work-up today showed pneumonia.  Please take the antibiotics for the next week to treat.  Please follow-up with your primary doctor.  If any symptoms change or worsen, please return to the nearest emergency department.

## 2019-12-27 ENCOUNTER — Encounter (HOSPITAL_BASED_OUTPATIENT_CLINIC_OR_DEPARTMENT_OTHER): Payer: Self-pay

## 2019-12-27 ENCOUNTER — Emergency Department (HOSPITAL_BASED_OUTPATIENT_CLINIC_OR_DEPARTMENT_OTHER)
Admission: EM | Admit: 2019-12-27 | Discharge: 2019-12-27 | Disposition: A | Discharge status: Home / Self Care | Payer: Self-pay | Attending: Emergency Medicine | Admitting: Emergency Medicine

## 2019-12-27 ENCOUNTER — Other Ambulatory Visit: Payer: Self-pay

## 2019-12-27 DIAGNOSIS — Z79899 Other long term (current) drug therapy: Secondary | ICD-10-CM | POA: Insufficient documentation

## 2019-12-27 DIAGNOSIS — N939 Abnormal uterine and vaginal bleeding, unspecified: Secondary | ICD-10-CM | POA: Insufficient documentation

## 2019-12-27 DIAGNOSIS — O0281 Inappropriate change in quantitative human chorionic gonadotropin (hCG) in early pregnancy: Secondary | ICD-10-CM | POA: Insufficient documentation

## 2019-12-27 DIAGNOSIS — R102 Pelvic and perineal pain: Secondary | ICD-10-CM | POA: Insufficient documentation

## 2019-12-27 DIAGNOSIS — J45909 Unspecified asthma, uncomplicated: Secondary | ICD-10-CM | POA: Insufficient documentation

## 2019-12-27 LAB — URINALYSIS, ROUTINE W REFLEX MICROSCOPIC
Bilirubin Urine: NEGATIVE
Glucose, UA: NEGATIVE mg/dL
Ketones, ur: NEGATIVE mg/dL
Nitrite: NEGATIVE
Protein, ur: NEGATIVE mg/dL
Specific Gravity, Urine: 1.02 (ref 1.005–1.030)
pH: 6 (ref 5.0–8.0)

## 2019-12-27 LAB — HCG, QUANTITATIVE, PREGNANCY: hCG, Beta Chain, Quant, S: <1 m[IU]/mL (ref <5)

## 2019-12-27 LAB — PREGNANCY, URINE: Preg Test, Ur: NEGATIVE

## 2019-12-27 LAB — URINALYSIS, MICROSCOPIC (REFLEX)

## 2019-12-27 NOTE — ED Provider Notes (Signed)
[facility] EMERGENCY DEPARTMENT Provider Note   CSN: [id number] Arrival date & time: 12/27/19  0033     History Chief Complaint  Patient presents with  . Vaginal Bleeding    Krista Freeman is a 29 y.o. female.  HPI     This is a 29 year old G5 P4 female who presents with vaginal bleeding.  Patient reports that she was due for her menstrual period on 12/11/2019.  When she did not get her period, she took a home pregnancy test that was "lightly positive."  She has not been having any concerning symptoms.  Yesterday she noted some light spotting and today is having a flow more like a period.  She is not passed clots or had significant bleeding.  She reports some intermittent crampy abdominal pain.  No lateralizing pain.  Denies any urinary symptoms.  Past Medical History:  Diagnosis Date  . Asthma     There are no problems to display for this patient.   History reviewed. No pertinent surgical history.   OB History    Gravida  5   Para  4   Term      Preterm      AB  1   Living        SAB      TAB      Ectopic      Multiple      Live Births              No family history on file.  Social History   Tobacco Use  . Smoking status: Never Smoker  . Smokeless tobacco: Never Used  Substance Use Topics  . Alcohol use: Yes  . Drug use: Not Currently    Frequency: 7.0 times per week    Types: Marijuana    Comment: last use 3 days ago. occasiona user    Home Medications Prior to Admission medications   Medication Sig Start Date End Date Taking? Authorizing Provider  naproxen (NAPROSYN) 500 MG tablet Take 1 tablet (500 mg total) by mouth 2 (two) times daily as needed. 02/14/19   Glynn Octave, MD    Allergies    Patient has no known allergies.  Review of Systems   Review of Systems  Constitutional: Negative for fever.  Respiratory: Negative for shortness of breath.   Cardiovascular: Negative for chest pain.  Gastrointestinal:  Negative for abdominal pain.  Genitourinary: Positive for pelvic pain and vaginal bleeding. Negative for dysuria.  All other systems reviewed and are negative.   Physical Exam Updated Vital Signs BP (!) 143/89 (BP Location: Right Wrist)   Pulse 72   Temp 98.1 F (36.7 C) (Oral)   Resp 20   Ht 1.626 m (5\' 4" )   Wt 117 kg   LMP 11/10/2019   SpO2 100%   BMI 44.29 kg/m   Physical Exam Vitals and nursing note reviewed.  Constitutional:      Appearance: She is well-developed. She is obese.  HENT:     Head: Normocephalic and atraumatic.  Eyes:     Pupils: Pupils are equal, round, and reactive to light.  Cardiovascular:     Rate and Rhythm: Normal rate and regular rhythm.     Heart sounds: Normal heart sounds.  Pulmonary:     Effort: Pulmonary effort is normal. No respiratory distress.     Breath sounds: No wheezing.  Abdominal:     Palpations: Abdomen is soft.     Tenderness: There is no abdominal  tenderness. There is no guarding or rebound.  Genitourinary:    Comments: Normal external vaginal exam, scant to minimal blood noted in the vaginal vault, no clots noted, cervical os closed, no adnexal tenderness or mass Musculoskeletal:     Cervical back: Neck supple.  Skin:    General: Skin is warm and dry.  Neurological:     Mental Status: She is alert and oriented to person, place, and time.  Psychiatric:        Mood and Affect: Mood normal.     ED Results / Procedures / Treatments   Labs (all labs ordered are listed, but only abnormal results are displayed) Labs Reviewed  URINALYSIS, ROUTINE W REFLEX MICROSCOPIC - Abnormal; Notable for the following components:      Result Value   Hgb urine dipstick LARGE (*)    Leukocytes,Ua TRACE (*)    All other components within normal limits  URINALYSIS, MICROSCOPIC (REFLEX) - Abnormal; Notable for the following components:   Bacteria, UA FEW (*)    All other components within normal limits  PREGNANCY, URINE  HCG,  QUANTITATIVE, PREGNANCY    EKG None  Radiology No results found.  Procedures Procedures (including critical care time)  Medications Ordered in ED Medications - No data to display  ED Course  I have reviewed the triage vital signs and the nursing notes.  Pertinent labs & imaging results that were available during my care of the patient were reviewed by me and considered in my medical decision making (see chart for details).    MDM Rules/Calculators/A&P                      Patient presents with vaginal bleeding in the setting of recent missed period and reported positive home pregnancy test.  She is overall nontoxic and vital signs are reassuring.  Her pelvic exam does not show any significant clots or large-volume bleeding.  Cervical os is closed.  Both urine pregnancy and beta-hCG are negative at this time.  Patient describes seeing a faint but obvious line on her pregnancy test 2 weeks ago.  Question whether she may have experienced a chemical pregnancy or very early miscarriage.  Given that her abdominal exam is benign, would have lower suspicion for ectopic pregnancy.  Patient will monitor her symptoms closely.  No indication for ultrasound at this time.  She was given strict return precautions.  After history, exam, and medical workup I feel the patient has been appropriately medically screened and is safe for discharge home. Pertinent diagnoses were discussed with the patient. Patient was given return precautions.   Final Clinical Impression(s) / ED Diagnoses Final diagnoses:  Vaginal bleeding  Chemical pregnancy    Rx / DC Orders ED Discharge Orders    None       Merryl Hacker, MD 12/27/19 [phone]

## 2019-12-27 NOTE — ED Triage Notes (Signed)
Pt had a + home pregnancy test a couple of weeks ago and now pt is having light vaginal bleeding and mild cramps. Pt's missed period was 1/2.

## 2019-12-27 NOTE — Discharge Instructions (Addendum)
You were seen today with vaginal bleeding in the setting of a positive home pregnancy test.  Unfortunately, your pregnancy test here are negative.  Given how early you were in pregnancy, you likely experienced a chemical pregnancy or a very early miscarriage.  Given that you are not having any abdominal pain, have lower suspicion for ectopic pregnancy.  Monitor your symptoms closely.  As long as you do not not have increasing bleeding or increasing abdominal pain, follow-up with your OB/GYN routinely.

## 2020-12-25 IMAGING — CT CT ANGIOGRAPHY CHEST
1 of 14 series · 2 of 16 positions shown · IV contrast (omnipaque)
Comparison: None.

CLINICAL DATA: Complaints of right-sided rib pain and chest pain.
Positive D-dimer.

EXAM:
CT ANGIOGRAPHY CHEST WITH CONTRAST
TECHNIQUE: Multidetector CT imaging of the chest was performed using the
standard protocol during bolus administration of intravenous
contrast. Multiplanar CT image reconstructions and MIPs were
obtained to evaluate the vascular anatomy.
CONTRAST:  A total of 175 mL OMNIPAQUE IOHEXOL 350 MG/ML SOLN was
administered across 2 scans.

[Series 10: pe thins · axial · 0.67mm/px · z∈[-379,-73]mm · 2 of 307 slices shown]
[im 1/307  lung]
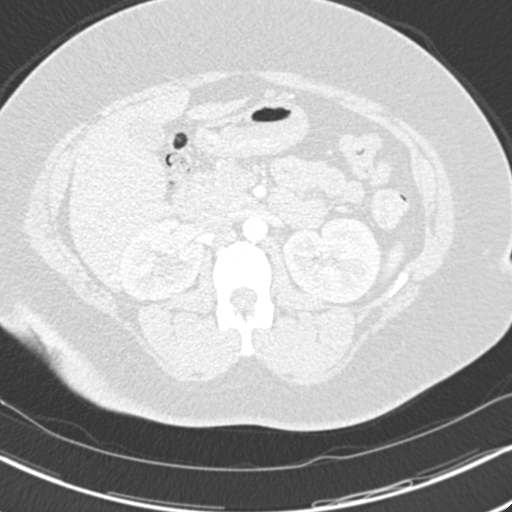
[im 307/307  soft-tissue]
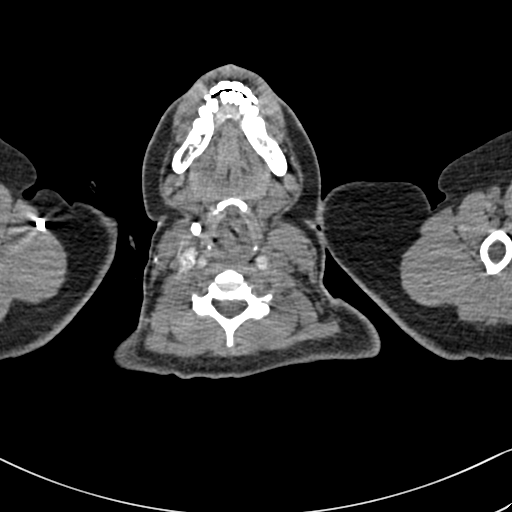

[2 of 16 positions shown; findings below may reference images not displayed]

FINDINGS: Cardiovascular: Evaluation is limited by suboptimal contrast bolus
timing. There is no large centrally located pulmonary embolus.
Detection of smaller segmental and subsegmental pulmonary emboli is
limited. There is some mild motion artifact which limits evaluation.
The heart size is normal. The aorta is unremarkable. There is no
significant pericardial effusion.

Mediastinum/Nodes:

--there is a prominent right hilar lymph node measuring 1.5 x 2 cm.
There are no additional enlarged mediastinal or hilar lymph nodes.

--there are some prominent but morphologically normal appearing
bilateral axillary lymph nodes.

--No supraclavicular lymphadenopathy.

--Normal thyroid gland.

--The esophagus is unremarkable

Lungs/Pleura: There is airspace consolidation involving the right
upper lobe measuring approximately 3.6 cm across. This consolidation
is located medially in the right suprahilar region. There is
presumed atelectasis involving the medial left lower lobe. There is
no pneumothorax. No pleural effusion.

Upper Abdomen: There is appears to be mild right-sided collecting
system dilatation.

Musculoskeletal: There is no displaced fracture. Two left-sided
breast nodules are noted. These are not well characterized on this
examination. The largest measures approximately 2.2 x 1.7 cm. More
superiorly in the subareolar region there is a 1.1 by 0.7 cm nodule.

Review of the MIP images confirms the above findings.
IMPRESSION: 1. Evaluation for pulmonary emboli is limited by suboptimal contrast
bolus timing. Given this limitation, there is no large centrally
located pulmonary embolus detected on this exam.
2. Right upper lobe consolidation in the medial suprahilar region is
concerning for pneumonia in the appropriate clinical setting. There
is a smaller opacity in the medial left lower lobe immediately
adjacent to the descending aorta. This could represent an additional
infiltrate or atelectasis.
3. Left-sided breast nodules as detailed above. Follow-up with
dedicated outpatient breast imaging is recommended for further
evaluation as these are incompletely evaluated on this exam.
4. Mildly enlarged right hilar lymph node, presumably reactive in
etiology.
5. Possible mild right-sided renal collecting system dilatation. If
there is clinical concern for renal pathology, follow-up with
ultrasound is recommended for further evaluation.
